# Patient Record
Sex: Female | Born: 1940 | Race: White | Hispanic: No | Smoking: Former smoker
Health system: Southern US, Community
[De-identification: ages and names within clinical notes are randomized; demographics above are authoritative.]

## PROBLEM LIST (undated history)

## (undated) DIAGNOSIS — H5440 Blindness, one eye, unspecified eye: Secondary | ICD-10-CM

## (undated) DIAGNOSIS — T7840XA Allergy, unspecified, initial encounter: Secondary | ICD-10-CM

## (undated) DIAGNOSIS — K219 Gastro-esophageal reflux disease without esophagitis: Secondary | ICD-10-CM

## (undated) DIAGNOSIS — J189 Pneumonia, unspecified organism: Secondary | ICD-10-CM

## (undated) DIAGNOSIS — K8020 Calculus of gallbladder without cholecystitis without obstruction: Secondary | ICD-10-CM

## (undated) DIAGNOSIS — A770 Spotted fever due to Rickettsia rickettsii: Secondary | ICD-10-CM

## (undated) DIAGNOSIS — Z923 Personal history of irradiation: Secondary | ICD-10-CM

## (undated) DIAGNOSIS — K5730 Diverticulosis of large intestine without perforation or abscess without bleeding: Secondary | ICD-10-CM

## (undated) DIAGNOSIS — M7120 Synovial cyst of popliteal space [Baker], unspecified knee: Secondary | ICD-10-CM

## (undated) DIAGNOSIS — Z609 Problem related to social environment, unspecified: Secondary | ICD-10-CM

## (undated) DIAGNOSIS — R209 Unspecified disturbances of skin sensation: Secondary | ICD-10-CM

## (undated) DIAGNOSIS — R51 Headache: Secondary | ICD-10-CM

## (undated) DIAGNOSIS — Z659 Problem related to unspecified psychosocial circumstances: Secondary | ICD-10-CM

## (undated) DIAGNOSIS — Z515 Encounter for palliative care: Secondary | ICD-10-CM

## (undated) DIAGNOSIS — H547 Unspecified visual loss: Secondary | ICD-10-CM

## (undated) DIAGNOSIS — C541 Malignant neoplasm of endometrium: Secondary | ICD-10-CM

## (undated) DIAGNOSIS — H209 Unspecified iridocyclitis: Secondary | ICD-10-CM

## (undated) DIAGNOSIS — M1990 Unspecified osteoarthritis, unspecified site: Secondary | ICD-10-CM

## (undated) DIAGNOSIS — K449 Diaphragmatic hernia without obstruction or gangrene: Secondary | ICD-10-CM

## (undated) DIAGNOSIS — I8390 Asymptomatic varicose veins of unspecified lower extremity: Secondary | ICD-10-CM

## (undated) DIAGNOSIS — A6920 Lyme disease, unspecified: Secondary | ICD-10-CM

## (undated) DIAGNOSIS — B351 Tinea unguium: Secondary | ICD-10-CM

## (undated) DIAGNOSIS — G5600 Carpal tunnel syndrome, unspecified upper limb: Secondary | ICD-10-CM

## (undated) DIAGNOSIS — A809 Acute poliomyelitis, unspecified: Secondary | ICD-10-CM

## (undated) DIAGNOSIS — N289 Disorder of kidney and ureter, unspecified: Secondary | ICD-10-CM

## (undated) DIAGNOSIS — K259 Gastric ulcer, unspecified as acute or chronic, without hemorrhage or perforation: Secondary | ICD-10-CM

## (undated) DIAGNOSIS — N939 Abnormal uterine and vaginal bleeding, unspecified: Principal | ICD-10-CM

## (undated) DIAGNOSIS — R609 Edema, unspecified: Secondary | ICD-10-CM

## (undated) DIAGNOSIS — I872 Venous insufficiency (chronic) (peripheral): Secondary | ICD-10-CM

## (undated) DIAGNOSIS — C189 Malignant neoplasm of colon, unspecified: Secondary | ICD-10-CM

## (undated) DIAGNOSIS — R413 Other amnesia: Secondary | ICD-10-CM

## (undated) DIAGNOSIS — K635 Polyp of colon: Secondary | ICD-10-CM

## (undated) HISTORY — DX: Unspecified visual loss: H54.7

## (undated) HISTORY — DX: Carpal tunnel syndrome, unspecified upper limb: G56.00

## (undated) HISTORY — DX: Edema, unspecified: R60.9

## (undated) HISTORY — PX: BACK SURGERY: SHX140

## (undated) HISTORY — PX: ADENOIDECTOMY: SHX5191

## (undated) HISTORY — DX: Venous insufficiency (chronic) (peripheral): I87.2

## (undated) HISTORY — DX: Synovial cyst of popliteal space (Baker), unspecified knee: M71.20

## (undated) HISTORY — DX: Diverticulosis of large intestine without perforation or abscess without bleeding: K57.30

## (undated) HISTORY — DX: Unspecified disturbances of skin sensation: R20.9

## (undated) HISTORY — DX: Personal history of irradiation: Z92.3

## (undated) HISTORY — DX: Abnormal uterine and vaginal bleeding, unspecified: N93.9

## (undated) HISTORY — DX: Acute poliomyelitis, unspecified: A80.9

## (undated) HISTORY — PX: TONSILLECTOMY: SUR1361

## (undated) HISTORY — DX: Calculus of gallbladder without cholecystitis without obstruction: K80.20

## (undated) HISTORY — DX: Malignant neoplasm of colon, unspecified: C18.9

## (undated) HISTORY — DX: Asymptomatic varicose veins of unspecified lower extremity: I83.90

## (undated) HISTORY — DX: Tinea unguium: B35.1

## (undated) HISTORY — DX: Unspecified osteoarthritis, unspecified site: M19.90

---

## 2003-07-22 ENCOUNTER — Ambulatory Visit (HOSPITAL_COMMUNITY): Admission: RE | Admit: 2003-07-22 | Discharge: 2003-07-22 | Payer: Self-pay | Admitting: Family Medicine

## 2004-06-01 ENCOUNTER — Emergency Department (HOSPITAL_COMMUNITY): Admission: EM | Admit: 2004-06-01 | Discharge: 2004-06-01 | Payer: Self-pay | Admitting: Emergency Medicine

## 2004-08-13 ENCOUNTER — Ambulatory Visit: Payer: Self-pay | Admitting: Family Medicine

## 2005-02-14 ENCOUNTER — Emergency Department (HOSPITAL_COMMUNITY): Admission: EM | Admit: 2005-02-14 | Discharge: 2005-02-14 | Payer: Self-pay | Admitting: Emergency Medicine

## 2005-03-10 ENCOUNTER — Ambulatory Visit: Payer: Self-pay | Admitting: Internal Medicine

## 2005-03-10 ENCOUNTER — Ambulatory Visit (HOSPITAL_COMMUNITY): Admission: RE | Admit: 2005-03-10 | Discharge: 2005-03-10 | Payer: Self-pay | Admitting: Internal Medicine

## 2006-01-05 ENCOUNTER — Ambulatory Visit (HOSPITAL_COMMUNITY): Admission: RE | Admit: 2006-01-05 | Discharge: 2006-01-05 | Payer: Self-pay | Admitting: Family Medicine

## 2010-03-31 ENCOUNTER — Other Ambulatory Visit (INDEPENDENT_AMBULATORY_CARE_PROVIDER_SITE_OTHER): Payer: Self-pay | Admitting: Internal Medicine

## 2010-03-31 ENCOUNTER — Ambulatory Visit (INDEPENDENT_AMBULATORY_CARE_PROVIDER_SITE_OTHER): Payer: Medicare Other | Admitting: Internal Medicine

## 2010-03-31 DIAGNOSIS — K6289 Other specified diseases of anus and rectum: Secondary | ICD-10-CM

## 2010-03-31 DIAGNOSIS — Z86010 Personal history of colonic polyps: Secondary | ICD-10-CM

## 2010-03-31 DIAGNOSIS — R1310 Dysphagia, unspecified: Secondary | ICD-10-CM

## 2010-04-01 ENCOUNTER — Ambulatory Visit (HOSPITAL_COMMUNITY)
Admission: RE | Admit: 2010-04-01 | Discharge: 2010-04-01 | Disposition: A | Discharge status: Home / Self Care | Payer: Medicare Other | Source: Ambulatory Visit | Attending: Internal Medicine | Admitting: Internal Medicine

## 2010-04-01 DIAGNOSIS — K219 Gastro-esophageal reflux disease without esophagitis: Secondary | ICD-10-CM | POA: Insufficient documentation

## 2010-04-01 DIAGNOSIS — R1013 Epigastric pain: Secondary | ICD-10-CM | POA: Insufficient documentation

## 2010-04-01 DIAGNOSIS — R1310 Dysphagia, unspecified: Secondary | ICD-10-CM | POA: Insufficient documentation

## 2010-04-23 ENCOUNTER — Encounter (INDEPENDENT_AMBULATORY_CARE_PROVIDER_SITE_OTHER): Payer: Medicare Other | Admitting: Internal Medicine

## 2010-04-23 ENCOUNTER — Ambulatory Visit (HOSPITAL_COMMUNITY): Admission: RE | Admit: 2010-04-23 | Payer: Medicare Other | Source: Ambulatory Visit | Admitting: Internal Medicine

## 2010-05-17 NOTE — Consult Note (Signed)
Tanya Ellis, Tanya Ellis                  ACCOUNT NO.:  [id number]  MEDICAL RECORD NO.:  [id number]           PATIENT TYPE:  O  LOCATION:  Office                       FACILITY:  [facility]  PHYSICIAN:  Lionel December, M.D.    DATE OF BIRTH:  06-24-1940  DATE OF CONSULTATION: 04/01/2010 DATE OF DISCHARGE:                                  CONSULTATION   REASON FOR CONSULTATION:  Colonoscopy/history of polyps.  HISTORY OF PRESENT ILLNESS:  Tanya Ellis is a 70 year old female referred to our office by Dr. Sudie Bailey for colonoscopy with a history of colon polyps.  Tanya Ellis states that her abdominal gas is better since starting the omeprazole first of the month.  Her epigastric pain is better.  Her acid reflux is better.  She does state that at times it feels like something is pressing on her rectum and this has occurred about 3 times in the past.  She states at times when she eat lot such as if she eats banana, it feels like the banana is slow to go down.  Foods that are chunky feel like they are slow to go down.  She usually has 1 to 2 bowel movements a day.  Her appetite is good.  There has been no weight loss. She denies any melena or bright red rectal bleeding.  Her last colonoscopy was in 2007 for surveillance colonoscopy.  She has a history of adenomas.  Per Dr. Patty Sermons note, there is a family history that is positive for colon cancer in her mother.  In 2005, she underwent an esophagoscopy, this was a capsule study, which revealed esophageal transit time was 6 minutes and 53 seconds.  The mucosa of the esophagus was normal.  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  There was no evidence of esophagitis, noncritical Schatzki's ring, small sliding hiatal hernia.  Labs on February 20, 2010, sodium 138, potassium 4.2, chloride 104, CO2 27, glucose 96, BUN 11, creatinine 0.76, bilirubin 0.6, ALP 60, AST 17, ALT 13, total protein 6.2, albumin 4.1, calcium 9.1,  WBC 6.8, hemoglobin 12.3, hematocrit 37.2, platelet count was low at 149, slightly low. Amylase 27 and lipase 10.  HOME MEDICATIONS:  She is on omeprazole 40 mg one a day.  ALLERGIES:  She is allergic to SULFA.  PAST SURGICAL HISTORY:  She has had back surgery twice for bone spurs. She had a T and A as a child.  She has no medical problems.  FAMILY HISTORY:  Mother is deceased from a TIA.  Father is deceased, he was killed in world war 2.  One sister in good health.  She is married. She is self-employed as an Medical laboratory scientific officer.  She does not smoke, drink, or do drugs.  Two children, one has a history of an MI and CVA at age 37 and one child who is deaf and has fibromyalgia.  OBJECTIVE:  VITAL SIGNS:  Her weight is 158.9, height 5 feet 4 inches, temperature 97, blood pressure is 132/82, pulse 72. HEENT:  She has upper dentures.  Her lower teeth are natural.  Her oral mucosa is moist, no lesions.  Her conjunctivae are pink.  Her sclerae are anicteric. NECK:  Her thyroid is normal.  There is no cervical lymphadenopathy. LUNGS:  Clear. ABDOMEN:  Soft.  Bowel sounds are positive.  She does have some lower abdominal suprapubic pain and epigastric tenderness.  ASSESSMENT:  Tanya Ellis is a 70 year old female who is due for a surveillance colonoscopy for adenomas.  We will schedule a colonoscopy. She also complains of some dysphagia, which actually is better since starting the omeprazole.  RECOMMENDATIONS: [facility]illi[facility] colonoscopy  We will also schedule a barium pill study for her.  If normal and she continues to have problems after a progress report in 2 weeks, we will schedule an EGD/ED with Dr. Karilyn Cota also and thank you for allowing [place] to participate in her care.    ______________________________ Dorene Ar, NP   ______________________________ Lionel December, M.D.    TS/MEDQ  D:  04/01/2010  T:  04/02/2010  Job:  [id number]  cc:   Mila Homer. Sudie Bailey, M.D. Fax:  [phone]  Electronically Signed by Dorene Ar PA on 05/15/2010 11:36:23 AM Electronically Signed by Lionel December M.D. on 05/17/2010 01:24:36 PM

## 2010-07-19 ENCOUNTER — Emergency Department (HOSPITAL_COMMUNITY)
Admission: EM | Admit: 2010-07-19 | Discharge: 2010-07-19 | Disposition: A | Discharge status: Home / Self Care | Payer: Medicare Other | Attending: Emergency Medicine | Admitting: Emergency Medicine

## 2010-07-19 DIAGNOSIS — W268XXA Contact with other sharp object(s), not elsewhere classified, initial encounter: Secondary | ICD-10-CM | POA: Insufficient documentation

## 2010-07-19 DIAGNOSIS — S91309A Unspecified open wound, unspecified foot, initial encounter: Secondary | ICD-10-CM | POA: Insufficient documentation

## 2010-07-19 DIAGNOSIS — Z23 Encounter for immunization: Secondary | ICD-10-CM | POA: Insufficient documentation

## 2010-09-05 ENCOUNTER — Other Ambulatory Visit: Payer: Self-pay

## 2010-09-05 ENCOUNTER — Emergency Department (HOSPITAL_COMMUNITY)
Admission: EM | Admit: 2010-09-05 | Discharge: 2010-09-05 | Disposition: A | Discharge status: Home / Self Care | Payer: Medicare Other | Attending: Emergency Medicine | Admitting: Emergency Medicine

## 2010-09-05 ENCOUNTER — Emergency Department (HOSPITAL_COMMUNITY): Payer: Medicare Other

## 2010-09-05 DIAGNOSIS — Z8619 Personal history of other infectious and parasitic diseases: Secondary | ICD-10-CM | POA: Insufficient documentation

## 2010-09-05 DIAGNOSIS — R05 Cough: Secondary | ICD-10-CM | POA: Insufficient documentation

## 2010-09-05 DIAGNOSIS — R059 Cough, unspecified: Secondary | ICD-10-CM | POA: Insufficient documentation

## 2010-09-05 DIAGNOSIS — J40 Bronchitis, not specified as acute or chronic: Secondary | ICD-10-CM | POA: Insufficient documentation

## 2010-09-05 HISTORY — DX: Acute poliomyelitis, unspecified: A80.9

## 2010-09-05 HISTORY — DX: Spotted fever due to Rickettsia rickettsii: A77.0

## 2010-09-05 HISTORY — DX: Lyme disease, unspecified: A69.20

## 2010-09-05 LAB — BASIC METABOLIC PANEL
Calcium: 9.5 mg/dL (ref 8.4–10.5)
GFR calc non Af Amer: >60 mL/min (ref >60)
Glucose, Bld: 105 mg/dL — ABNORMAL HIGH (ref 70–99)
Sodium: 133 mEq/L — ABNORMAL LOW (ref 135–145)

## 2010-09-05 LAB — CBC
MCH: 30.7 pg (ref 26.0–34.0)
MCHC: 34.1 g/dL (ref 30.0–36.0)
Platelets: 110 10*3/uL — ABNORMAL LOW (ref 150–400)

## 2010-09-05 LAB — CARDIAC PANEL(CRET KIN+CKTOT+MB+TROPI)
CK, MB: 5 ng/mL — ABNORMAL HIGH (ref 0.3–4.0)
Total CK: 106 U/L (ref 7–177)

## 2010-09-05 MED ORDER — ALBUTEROL SULFATE (2.5 MG/3ML) 0.083% IN NEBU
INHALATION_SOLUTION | RESPIRATORY_TRACT | Status: AC
  Administered 2010-09-05: 5 mg
  Filled 2010-09-05: qty 6

## 2010-09-05 MED ORDER — ALBUTEROL SULFATE HFA 108 (90 BASE) MCG/ACT IN AERS
2.0000 | INHALATION_SPRAY | Freq: Once | RESPIRATORY_TRACT | Status: AC
  Administered 2010-09-05: 2 via RESPIRATORY_TRACT
  Filled 2010-09-05: qty 6.7

## 2010-09-05 MED ORDER — LEVOFLOXACIN 500 MG PO TABS: 500.0000 mg | ORAL_TABLET | Freq: Every day | ORAL | Status: AC

## 2010-09-05 MED ORDER — ALBUTEROL SULFATE (5 MG/ML) 0.5% IN NEBU: 5.0000 mg | INHALATION_SOLUTION | Freq: Once | RESPIRATORY_TRACT | Status: DC

## 2010-09-05 MED ORDER — DEXTROSE 5 % IV SOLN
1.0000 g | Freq: Once | INTRAVENOUS | Status: AC
  Administered 2010-09-05: 1 g via INTRAVENOUS
  Filled 2010-09-05: qty 1

## 2010-09-05 MED ORDER — ALBUTEROL SULFATE HFA 108 (90 BASE) MCG/ACT IN AERS: 1.0000 | INHALATION_SPRAY | Freq: Four times a day (QID) | RESPIRATORY_TRACT | Status: DC | PRN

## 2010-09-05 MED ORDER — SODIUM CHLORIDE 0.9 % IJ SOLN
10.0000 mL | Freq: Once | INTRAMUSCULAR | Status: AC
  Administered 2010-09-05: 10 mL via INTRAVENOUS

## 2010-09-05 MED ORDER — ALBUTEROL SULFATE (2.5 MG/3ML) 0.083% IN NEBU
INHALATION_SOLUTION | RESPIRATORY_TRACT | Status: AC
  Administered 2010-09-05: 5 mg via RESPIRATORY_TRACT
  Filled 2010-09-05: qty 6

## 2010-09-05 MED ORDER — ALBUTEROL SULFATE (5 MG/ML) 0.5% IN NEBU
2.5000 mg | INHALATION_SOLUTION | Freq: Once | RESPIRATORY_TRACT | Status: AC
  Administered 2010-09-05: 5 mg via RESPIRATORY_TRACT

## 2010-09-05 MED ORDER — IPRATROPIUM BROMIDE 0.02 % IN SOLN
0.5000 mg | Freq: Once | RESPIRATORY_TRACT | Status: AC
  Administered 2010-09-05: 0.5 mg via RESPIRATORY_TRACT
  Filled 2010-09-05: qty 2.5

## 2010-09-05 MED ORDER — SODIUM CHLORIDE 0.9 % IJ SOLN
INTRAMUSCULAR | Status: AC
  Administered 2010-09-05: 21:00:00
  Filled 2010-09-05: qty 10

## 2010-09-05 MED ORDER — AEROCHAMBER Z-STAT PLUS/MEDIUM MISC
1.0000 | Freq: Once | Status: AC
  Administered 2010-09-05: 1

## 2010-09-05 NOTE — ED Provider Notes (Signed)
History     Chief Complaint  Patient presents with  . Cough   Patient is a 70 y.o. female presenting with cough. The history is provided by the patient. No language interpreter was used.  Cough This is a new problem. The current episode started more than 1 week ago. The problem occurs constantly. The problem has not changed since onset.The cough is productive of purulent sputum. The maximum temperature recorded prior to her arrival was 103 to 104 F. The fever has been present for 5 days or more. Associated symptoms include chest pain, chills, shortness of breath and wheezing. Pertinent negatives include no sweats, no weight loss and no headaches. She has tried nothing for the symptoms. The treatment provided no relief. She is not a smoker. Her past medical history does not include pneumonia, bronchiectasis or emphysema.    Past Medical History  Diagnosis Date  . [facility] spotted fever   . Lyme disease   . Polio   . Measles   . Mumps     Past Surgical History  Procedure Date  . Back surgery   . Tonsillectomy   . Adenoidectomy     No family history on file.  History  Substance Use Topics  . Smoking status: Never Smoker   . Smokeless tobacco: Not on file  . Alcohol Use: No    OB History    Grav Para Term Preterm Abortions TAB SAB Ect Mult Living                  Review of Systems  Constitutional: Positive for fever and chills. Negative for weight loss.  HENT: Negative for facial swelling.   Eyes: Negative for discharge.  Respiratory: Positive for cough, shortness of breath and wheezing.   Cardiovascular: Positive for chest pain.  Genitourinary: Negative for difficulty urinating.  Musculoskeletal: Negative for arthralgias.  Skin: Negative.   Neurological: Negative for headaches.  Hematological: Negative.   Psychiatric/Behavioral: Negative.     Physical Exam  BP 114/87  Pulse 74  Temp(Src) 98.3 F (36.8 C) (Oral)  Resp 21  Ht 5\' 4"  (1.626 m)  Wt 146 lb  (66.225 kg)  BMI 25.06 kg/m2  SpO2 99%  Physical Exam  Constitutional: She is oriented to person, place, and time. She appears well-developed and well-nourished.  HENT:  Head: Normocephalic and atraumatic.  Eyes: EOM are normal. Pupils are equal, round, and reactive to light.  Neck: Normal range of motion. Neck supple.  Cardiovascular: Normal rate and regular rhythm.   Pulmonary/Chest: No accessory muscle usage. Tachypnea noted. No apnea. She has decreased breath sounds in the right upper field, the right middle field, the right lower field, the left upper field, the left middle field and the left lower field. She has wheezes. She has rhonchi. She has no rales.  Abdominal: Soft. Bowel sounds are normal. She exhibits no distension. There is no tenderness. There is no rebound and no guarding.  Musculoskeletal: Normal range of motion.  Neurological: She is alert and oriented to person, place, and time.  Skin: Skin is warm and dry.  Psychiatric: She has a normal mood and affect.    ED Course  Procedures  MDM  Date: 09/05/2010  Rate: 66  Rhythm: normal sinus rhythm  QRS Axis: normal  Intervals: normal  ST/T Wave abnormalities: normal  Conduction Disutrbances:none  Narrative Interpretation: normal  Old EKG Reviewed: none available       Jennings Corado K Jaquanda Wickersham-Rasch, MD 09/05/10 2315

## 2010-09-05 NOTE — ED Notes (Signed)
Police sitting with patient - pt denies pain or needs at present

## 2010-09-05 NOTE — ED Notes (Signed)
Pt c/o cough with thick yellow/brown sputum for 3-4 days, worse today, pt called ems out because she became choked on thick sputum, fever this am,

## 2010-09-05 NOTE — ED Notes (Signed)
Pt has had cough,fever, brownish/greenish sputum for several days that has progressively gotten worse. Pt became choked today on sputum and EMS. nad noted upon arrival.

## 2010-09-05 NOTE — ED Notes (Signed)
Pt states expectorated a large mucous plug and feels much better - would like to come home - MD notified and went to auscultate pt and she sounds much better - MD allowing pt to be d/c'd home

## 2010-09-05 NOTE — ED Notes (Signed)
Respiratory Therapy notified regarding neb treatment

## 2010-09-10 LAB — CULTURE, BLOOD (ROUTINE X 2)

## 2011-02-09 ENCOUNTER — Other Ambulatory Visit: Payer: Self-pay

## 2011-02-09 ENCOUNTER — Emergency Department (HOSPITAL_COMMUNITY): Payer: Medicare Other

## 2011-02-09 ENCOUNTER — Emergency Department (HOSPITAL_COMMUNITY)
Admission: EM | Admit: 2011-02-09 | Discharge: 2011-02-09 | Disposition: A | Discharge status: Home / Self Care | Payer: Medicare Other | Attending: Emergency Medicine | Admitting: Emergency Medicine

## 2011-02-09 ENCOUNTER — Encounter (HOSPITAL_COMMUNITY): Payer: Self-pay | Admitting: *Deleted

## 2011-02-09 DIAGNOSIS — Z860100 Personal history of colon polyps, unspecified: Secondary | ICD-10-CM | POA: Insufficient documentation

## 2011-02-09 DIAGNOSIS — K449 Diaphragmatic hernia without obstruction or gangrene: Secondary | ICD-10-CM | POA: Insufficient documentation

## 2011-02-09 DIAGNOSIS — K219 Gastro-esophageal reflux disease without esophagitis: Secondary | ICD-10-CM | POA: Insufficient documentation

## 2011-02-09 DIAGNOSIS — R51 Headache: Secondary | ICD-10-CM | POA: Insufficient documentation

## 2011-02-09 DIAGNOSIS — N390 Urinary tract infection, site not specified: Secondary | ICD-10-CM

## 2011-02-09 DIAGNOSIS — H5440 Blindness, one eye, unspecified eye: Secondary | ICD-10-CM | POA: Insufficient documentation

## 2011-02-09 DIAGNOSIS — R072 Precordial pain: Secondary | ICD-10-CM | POA: Insufficient documentation

## 2011-02-09 DIAGNOSIS — I498 Other specified cardiac arrhythmias: Secondary | ICD-10-CM | POA: Insufficient documentation

## 2011-02-09 HISTORY — DX: Gastro-esophageal reflux disease without esophagitis: K21.9

## 2011-02-09 HISTORY — DX: Unspecified iridocyclitis: H20.9

## 2011-02-09 HISTORY — DX: Headache: R51

## 2011-02-09 HISTORY — DX: Blindness, one eye, unspecified eye: H54.40

## 2011-02-09 HISTORY — DX: Polyp of colon: K63.5

## 2011-02-09 HISTORY — DX: Diaphragmatic hernia without obstruction or gangrene: K44.9

## 2011-02-09 LAB — URINALYSIS, ROUTINE W REFLEX MICROSCOPIC
Nitrite: NEGATIVE
Specific Gravity, Urine: 1.01 (ref 1.005–1.030)
pH: 6.5 (ref 5.0–8.0)

## 2011-02-09 LAB — DIFFERENTIAL
Eosinophils Absolute: 0.1 10*3/uL (ref 0.0–0.7)
Eosinophils Relative: 2 % (ref 0–5)
Lymphocytes Relative: 38 % (ref 12–46)
Lymphs Abs: 2.4 10*3/uL (ref 0.7–4.0)
Monocytes Relative: 8 % (ref 3–12)

## 2011-02-09 LAB — CBC
Hemoglobin: 12.4 g/dL (ref 12.0–15.0)
MCH: 30.9 pg (ref 26.0–34.0)
RBC: 4.01 MIL/uL (ref 3.87–5.11)

## 2011-02-09 LAB — BASIC METABOLIC PANEL
CO2: 26 mEq/L (ref 19–32)
Calcium: 10 mg/dL (ref 8.4–10.5)
Chloride: 102 mEq/L (ref 96–112)
Glucose, Bld: 107 mg/dL — ABNORMAL HIGH (ref 70–99)
Potassium: 3.6 mEq/L (ref 3.5–5.1)
Sodium: 137 mEq/L (ref 135–145)

## 2011-02-09 LAB — POCT I-STAT TROPONIN I

## 2011-02-09 LAB — URINE MICROSCOPIC-ADD ON

## 2011-02-09 MED ORDER — CEPHALEXIN 500 MG PO CAPS: 500.0000 mg | ORAL_CAPSULE | Freq: Four times a day (QID) | ORAL | Status: AC

## 2011-02-09 MED ORDER — OXYCODONE-ACETAMINOPHEN 5-325 MG PO TABS
1.0000 | ORAL_TABLET | Freq: Once | ORAL | Status: DC
  Filled 2011-02-09: qty 1

## 2011-02-09 MED ORDER — FAMOTIDINE IN NACL 20-0.9 MG/50ML-% IV SOLN
20.0000 mg | Freq: Once | INTRAVENOUS | Status: AC
  Administered 2011-02-09: 20 mg via INTRAVENOUS
  Filled 2011-02-09: qty 50

## 2011-02-09 MED ORDER — GI COCKTAIL ~~LOC~~
30.0000 mL | Freq: Once | ORAL | Status: AC
  Administered 2011-02-09: 30 mL via ORAL
  Filled 2011-02-09: qty 30

## 2011-02-09 MED ORDER — SODIUM CHLORIDE 0.9 % IV SOLN
INTRAVENOUS | Status: DC
  Administered 2011-02-09: 15:00:00 via INTRAVENOUS

## 2011-02-09 NOTE — ED Notes (Signed)
Pt c/o substernal chest pain for a couple hours. Pt also c/o nausea and headache.

## 2011-02-09 NOTE — ED Notes (Signed)
Pt left d/c papers with rx in room.  Called pt's home number with no answer, unable to leave message.  Will attempt to call back at a later time.

## 2011-02-09 NOTE — ED Notes (Signed)
Patient ambulatory to restroom. Urine sample collected if needed.

## 2011-02-09 NOTE — ED Notes (Signed)
Pt states is feeling better and wanting to know status of disposition because she needs to go home.  edp notified.

## 2011-02-09 NOTE — ED Provider Notes (Signed)
History     CSN: [id number] Arrival date & time: 02/09/2011  1:29 PM   Chief Complaint  Patient presents with  . Chest Pain   HPI Pt was seen at 1420.  Per pt, c/o gradual onset and persistence of constant frontal headache and lower mid-sternal chest "pain" since overnight last night.  Pt describes the CP as "burning" and "sometimes sharp."  States she has a history of headaches, this headache is per her usual pain pattern, and she has not taken any OTC's for same.  Pt's symptoms have been assoc with mild nausea, "feeling anxious" and "feelling numb around my lips" since last night. States she "passed a lot of gas" on arrival to ED which has started to relieve her symptoms. Denies abd pain, no palpitations, no SOB/cough, no fevers, no back pain, no visual changes, no dysphagia, no focal motor weakness, no tingling/numbness in extremities.   GI:  Dr. Karilyn Cota Past Medical History  Diagnosis Date  . [facility] spotted fever   . Lyme disease   . Polio   . Measles   . Mumps   . GERD (gastroesophageal reflux disease)   . Sliding hiatal hernia   . Headache   . Uveitis   . Blind right eye   . Colon polyp     Past Surgical History  Procedure Date  . Back surgery   . Tonsillectomy   . Adenoidectomy     History  Substance Use Topics  . Smoking status: Never Smoker   . Smokeless tobacco: Not on file  . Alcohol Use: No    Review of Systems ROS: Statement: All systems negative except as marked or noted in the HPI; Constitutional: Negative for fever and chills. ; ; Eyes: Negative for eye pain, redness and discharge. ; ; ENMT: Negative for ear pain, hoarseness, nasal congestion, sinus pressure and sore throat. ; ; Cardiovascular: +CP.  Negative for palpitations, diaphoresis, dyspnea and peripheral edema. ; ; Respiratory: Negative for cough, wheezing and stridor. ; ; Gastrointestinal: Negative for vomiting, diarrhea, abdominal pain, blood in stool, hematemesis, jaundice and rectal  bleeding. . ; ; Genitourinary: Negative for dysuria, flank pain and hematuria. ; ; Musculoskeletal: Negative for back pain and neck pain. Negative for swelling and trauma.; ; Skin: Negative for pruritus, rash, abrasions, blisters, bruising and skin lesion.; ; Neuro: +headache.  Negative for lightheadedness and neck stiffness. Negative for weakness, altered level of consciousness , altered mental status, extremity weakness, paresthesias, involuntary movement, seizure and syncope.     Allergies  Sulfa antibiotics  Home Medications  No current outpatient prescriptions on file.  BP 149/73  Pulse 75  Temp(Src) 97.7 F (36.5 C) (Oral)  Resp 16  Ht 5\' 4"  (1.626 m)  Wt 138 lb (62.596 kg)  BMI 23.69 kg/m2  SpO2 100%  Physical Exam 1425: Physical examination:  Nursing notes reviewed; Vital signs and O2 SAT reviewed;  Constitutional: Well developed, Well nourished, Well hydrated, In no acute distress; Head:  Normocephalic, atraumatic; Eyes: EOMI, PERRL, No scleral icterus; ENMT: Mouth and pharynx normal, Mucous membranes moist; Neck: Supple, Full range of motion, No lymphadenopathy; Cardiovascular: Regular rate and rhythm, No murmur, rub, or gallop; Respiratory: Breath sounds clear & equal bilaterally, No rales, rhonchi, wheezes, or rub, Normal respiratory effort/excursion; Chest: Nontender, Movement normal; Abdomen: Soft, Nontender, Nondistended, Normal bowel sounds; Genitourinary: No CVA tenderness; Extremities: Pulses normal, No tenderness, No edema, No calf edema or asymmetry.; Neuro: AA&Ox3, Major CN grossly intact. No facial droop, speech clear.  Normal coordination. Gait steady. No gross focal motor or sensory deficits in extremities.; Skin: Color normal, Warm, Dry, no rash. Psych:  Anxious.    ED Course  Procedures    MDM  MDM Reviewed: nursing note, vitals and previous chart Interpretation: ECG, labs and x-ray    Date: 02/09/2011  Rate: 69  Rhythm: sinus arrhythmia  QRS Axis:  normal  Intervals: normal  ST/T Wave abnormalities: normal  Conduction Disutrbances:none  Narrative Interpretation:   Old EKG Reviewed: none available.  Results for orders placed during the hospital encounter of 02/09/11  CBC      Component Value Range   WBC 6.2  4.0 - 10.5 (K/uL)   RBC 4.01  3.87 - 5.11 (MIL/uL)   Hemoglobin 12.4  12.0 - 15.0 (g/dL)   HCT 21.3  08.6 - 57.8 (%)   MCV 90.5  78.0 - 100.0 (fL)   MCH 30.9  26.0 - 34.0 (pg)   MCHC 34.2  30.0 - 36.0 (g/dL)   RDW 46.9  62.9 - 52.8 (%)   Platelets 145 (*) 150 - 400 (K/uL)  BASIC METABOLIC PANEL      Component Value Range   Sodium 137  135 - 145 (mEq/L)   Potassium 3.6  3.5 - 5.1 (mEq/L)   Chloride 102  96 - 112 (mEq/L)   CO2 26  19 - 32 (mEq/L)   Glucose, Bld 107 (*) 70 - 99 (mg/dL)   BUN 11  6 - 23 (mg/dL)   Creatinine, Ser 4.13  0.50 - 1.10 (mg/dL)   Calcium 24.4  8.4 - 10.5 (mg/dL)   GFR calc non Af Amer 83 (*) >90 (mL/min)   GFR calc Af Amer >90  >90 (mL/min)  POCT I-STAT TROPONIN I      Component Value Range   Troponin i, poc 0.00  0.00 - 0.08 (ng/mL)   Comment 3           URINALYSIS, ROUTINE W REFLEX MICROSCOPIC      Component Value Range   Color, Urine YELLOW  YELLOW    APPearance CLEAR  CLEAR    Specific Gravity, Urine 1.010  1.005 - 1.030    pH 6.5  5.0 - 8.0    Glucose, UA NEGATIVE  NEGATIVE (mg/dL)   Hgb urine dipstick NEGATIVE  NEGATIVE    Bilirubin Urine NEGATIVE  NEGATIVE    Ketones, ur NEGATIVE  NEGATIVE (mg/dL)   Protein, ur NEGATIVE  NEGATIVE (mg/dL)   Urobilinogen, UA 0.2  0.0 - 1.0 (mg/dL)   Nitrite NEGATIVE  NEGATIVE    Leukocytes, UA SMALL (*) NEGATIVE   DIFFERENTIAL      Component Value Range   Neutrophils Relative 51  43 - 77 (%)   Neutro Abs 3.2  1.7 - 7.7 (K/uL)   Lymphocytes Relative 38  12 - 46 (%)   Lymphs Abs 2.4  0.7 - 4.0 (K/uL)   Monocytes Relative 8  3 - 12 (%)   Monocytes Absolute 0.5  0.1 - 1.0 (K/uL)   Eosinophils Relative 2  0 - 5 (%)   Eosinophils Absolute 0.1   0.0 - 0.7 (K/uL)   Basophils Relative 1  0 - 1 (%)   Basophils Absolute 0.0  0.0 - 0.1 (K/uL)  URINE MICROSCOPIC-ADD ON      Component Value Range   WBC, UA 7-10  <3 (WBC/hpf)   Bacteria, UA FEW (*) RARE    Ct Head Wo Contrast  02/09/2011  *RADIOLOGY REPORT*  Clinical Data:  Headache, chest pain, nausea, history of polio, Lyme disease  CT HEAD WITHOUT CONTRAST  Technique:  Contiguous axial images were obtained from the base of the skull through the vertex without contrast.  Comparison: 06/01/2004  Findings: Normal ventricular morphology. No midline shift or mass effect. Normal appearance of brain parenchyma. No intracranial hemorrhage, mass lesion, or acute infarction. Visualized paranasal sinuses and mastoid air cells clear. Bones unremarkable.  IMPRESSION: No acute intracranial abnormalities.  Original Report Authenticated By: Lollie Marrow, M.D.   Chest Portable 1 View  02/09/2011  *RADIOLOGY REPORT*  Clinical Data: Chest pain  PORTABLE CHEST - 1 VIEW  Comparison: Portable exam 1400 hours compared to 09/05/2010  Findings: Upper-normal size of cardiac silhouette. Atherosclerotic calcification aorta. Pulmonary vascularity normal. Lungs clear. No pleural effusion or pneumothorax. No acute osseous findings.  IMPRESSION: No acute abnormalities.  Original Report Authenticated By: Lollie Marrow, M.D.     5:11 PM:  Pt states she feels "much better now" after GI cocktail and wants to go home.  Long hx of GERD well documented in EPIC/E-chart on review, and states she has not been taking her PPI due to "it makes me constipated."  Long d/w pt regarding need to take her GI meds.  She did not want the percocet.  Tol PO well in ED without N/V, has ambulated with steady gait/easy resps/no CP or SOB.  Doubt ACS as cause for symptoms given normal EKG and troponin after continuous symptoms since overnight last night. Endorses headache is per her usual and that she "just didn't take anything for it." Pt does not  want to stay any longer and wants to leave now. Will tx for UTI as UC pending.  Dx testing d/w pt.  Questions answered.  Verb understanding, agreeable to d/c home with outpt f/u.         Jalexis Breed Allison Quarry, DO 02/10/11 1155

## 2011-02-10 LAB — URINE CULTURE: Culture  Setup Time: 201212190218

## 2011-03-13 ENCOUNTER — Encounter (HOSPITAL_COMMUNITY): Payer: Self-pay

## 2011-03-13 ENCOUNTER — Emergency Department (HOSPITAL_COMMUNITY): Payer: Medicare Other

## 2011-03-13 ENCOUNTER — Emergency Department (HOSPITAL_COMMUNITY)
Admission: EM | Admit: 2011-03-13 | Discharge: 2011-03-13 | Disposition: A | Discharge status: Home / Self Care | Payer: Medicare Other | Attending: Emergency Medicine | Admitting: Emergency Medicine

## 2011-03-13 DIAGNOSIS — S6990XA Unspecified injury of unspecified wrist, hand and finger(s), initial encounter: Secondary | ICD-10-CM

## 2011-03-13 DIAGNOSIS — S6000XA Contusion of unspecified finger without damage to nail, initial encounter: Secondary | ICD-10-CM | POA: Insufficient documentation

## 2011-03-13 DIAGNOSIS — S6980XA Other specified injuries of unspecified wrist, hand and finger(s), initial encounter: Secondary | ICD-10-CM | POA: Insufficient documentation

## 2011-03-13 DIAGNOSIS — R51 Headache: Secondary | ICD-10-CM | POA: Insufficient documentation

## 2011-03-13 DIAGNOSIS — X58XXXA Exposure to other specified factors, initial encounter: Secondary | ICD-10-CM | POA: Insufficient documentation

## 2011-03-13 DIAGNOSIS — R110 Nausea: Secondary | ICD-10-CM | POA: Insufficient documentation

## 2011-03-13 DIAGNOSIS — B9789 Other viral agents as the cause of diseases classified elsewhere: Secondary | ICD-10-CM | POA: Insufficient documentation

## 2011-03-13 DIAGNOSIS — B349 Viral infection, unspecified: Secondary | ICD-10-CM

## 2011-03-13 DIAGNOSIS — R197 Diarrhea, unspecified: Secondary | ICD-10-CM | POA: Insufficient documentation

## 2011-03-13 LAB — CBC
HCT: 34 % — ABNORMAL LOW (ref 36.0–46.0)
Hemoglobin: 11.6 g/dL — ABNORMAL LOW (ref 12.0–15.0)
MCH: 30.6 pg (ref 26.0–34.0)
MCHC: 34.1 g/dL (ref 30.0–36.0)
RBC: 3.79 MIL/uL — ABNORMAL LOW (ref 3.87–5.11)

## 2011-03-13 LAB — BASIC METABOLIC PANEL
BUN: 9 mg/dL (ref 6–23)
CO2: 25 mEq/L (ref 19–32)
GFR calc non Af Amer: 85 mL/min — ABNORMAL LOW (ref >90)
Glucose, Bld: 125 mg/dL — ABNORMAL HIGH (ref 70–99)
Potassium: 3.6 mEq/L (ref 3.5–5.1)

## 2011-03-13 MED ORDER — HYDROCODONE-ACETAMINOPHEN 5-325 MG PO TABS: 1.0000 | ORAL_TABLET | Freq: Four times a day (QID) | ORAL | Status: AC | PRN

## 2011-03-13 MED ORDER — DIPHENHYDRAMINE HCL 50 MG/ML IJ SOLN
25.0000 mg | Freq: Once | INTRAMUSCULAR | Status: AC
  Administered 2011-03-13: 25 mg via INTRAVENOUS

## 2011-03-13 MED ORDER — SODIUM CHLORIDE 0.9 % IV BOLUS (SEPSIS)
500.0000 mL | Freq: Once | INTRAVENOUS | Status: AC
  Administered 2011-03-13: 500 mL via INTRAVENOUS

## 2011-03-13 MED ORDER — ONDANSETRON HCL 4 MG/2ML IJ SOLN
4.0000 mg | Freq: Once | INTRAMUSCULAR | Status: AC
  Administered 2011-03-13: 4 mg via INTRAVENOUS
  Filled 2011-03-13: qty 2

## 2011-03-13 MED ORDER — DIPHENHYDRAMINE HCL 50 MG/ML IJ SOLN
INTRAMUSCULAR | Status: AC
  Filled 2011-03-13: qty 1

## 2011-03-13 MED ORDER — VANCOMYCIN HCL IN DEXTROSE 1-5 GM/200ML-% IV SOLN
1000.0000 mg | Freq: Once | INTRAVENOUS | Status: AC
  Administered 2011-03-13: 1000 mg via INTRAVENOUS
  Filled 2011-03-13: qty 200

## 2011-03-13 MED ORDER — DOXYCYCLINE HYCLATE 100 MG PO CAPS: 100.0000 mg | ORAL_CAPSULE | Freq: Two times a day (BID) | ORAL | Status: DC

## 2011-03-13 MED ORDER — ONDANSETRON 4 MG PO TBDP: 4.0000 mg | ORAL_TABLET | Freq: Three times a day (TID) | ORAL | Status: AC | PRN

## 2011-03-13 MED ORDER — SODIUM CHLORIDE 0.9 % IV SOLN: INTRAVENOUS | Status: DC

## 2011-03-13 NOTE — ED Notes (Signed)
Pt reports body aches and chills.  Pt states she had abdominal pain that started this morning and watery stools x 2 today.  Pt states her stomach stopped hurting around the time she arrived in the ED.  Pt states she has not eaten anything today.

## 2011-03-13 NOTE — ED Notes (Signed)
Pt c/o itching to back of her head and center of chest. No rash noted. Pt medicated with Benadryl. Pt ambulated to restroom with assistance. NAD.

## 2011-03-13 NOTE — ED Notes (Signed)
Patient is alert and oriented x 4 with respirations even and unlabored.  NAD at this time.  Discharge instructions reviewed with patient and patient verbalized understanding.  Pt and family member verbalized that she will follow up with Dr. Sudie Bailey on Monday.  Pt ambulated to lobby with steady gait, and family member to transport pt home.

## 2011-03-13 NOTE — ED Provider Notes (Signed)
History     CSN: [id number]  Arrival date & time 03/13/11  1217   First MD Initiated Contact with Patient 03/13/11 1318      Chief Complaint  Patient presents with  . Nausea  . Headache  . Diarrhea  . Generalized Body Aches  . Finger Injury    (Consider location/radiation/quality/duration/timing/severity/associated sxs/prior treatment) The history is provided by the patient.   Patient is a 71 year old female presenting to the ED with 2 different complaints. First complaint is that of nausea diarrhea bodyaches headache across the sinus area that started today. She's been taking care of her daughter this had the flu patient's complaint as mentioned has been for some nausea some diarrhea now improving headache not severe no congestion no cough but bodyaches.  The main reason why she came today is for her right middle finger. She does not exactly recall how she hurt it. Thinks she may have dropped something on it. He said she has a history of some carpal tunnel problems and was present have much feeling in those fingers.  The right middle finger is now from him in the nail bed is black and blue. Past Medical History  Diagnosis Date  . [facility] spotted fever   . Lyme disease   . Polio   . Measles   . Mumps   . GERD (gastroesophageal reflux disease)   . Sliding hiatal hernia   . Headache   . Uveitis   . Blind right eye   . Colon polyp     Past Surgical History  Procedure Date  . Back surgery   . Tonsillectomy   . Adenoidectomy     No family history on file.  History  Substance Use Topics  . Smoking status: Never Smoker   . Smokeless tobacco: Not on file  . Alcohol Use: No    OB History    Grav Para Term Preterm Abortions TAB SAB Ect Mult Living                  Review of Systems  Constitutional: Negative for fever and chills.  HENT: Positive for congestion. Negative for sore throat.   Eyes: Negative for pain and visual disturbance.  Respiratory:  Negative for cough and shortness of breath.   Cardiovascular: Negative for chest pain.  Gastrointestinal: Positive for nausea and diarrhea. Negative for vomiting and abdominal pain.  Genitourinary: Negative for dysuria.  Musculoskeletal: Positive for joint swelling. Negative for back pain.  Neurological: Positive for numbness and headaches. Negative for weakness.  Hematological: Does not bruise/bleed easily.    Allergies  Sulfa antibiotics  Home Medications   Current Outpatient Rx  Name Route Sig Dispense Refill  . OMEPRAZOLE 40 MG PO CPDR Oral Take 40 mg by mouth at bedtime.    Marland Kitchen DOXYCYCLINE HYCLATE 100 MG PO CAPS Oral Take 1 capsule (100 mg total) by mouth 2 (two) times daily. 14 capsule 0  . ONDANSETRON 4 MG PO TBDP Oral Take 1 tablet (4 mg total) by mouth every 8 (eight) hours as needed for nausea. 10 tablet 0    BP 108/64  Pulse 78  Temp(Src) 98.6 F (37 C) (Oral)  Resp 20  Ht 5\' 4"  (1.626 m)  Wt 136 lb (61.689 kg)  BMI 23.34 kg/m2  SpO2 99%  Physical Exam  Nursing note and vitals reviewed. Constitutional: She is oriented to person, place, and time. She appears well-developed and well-nourished. No distress.  HENT:  Head: Normocephalic and atraumatic.  Mouth/Throat: Oropharynx is clear and moist.  Eyes: Conjunctivae and EOM are normal. Pupils are equal, round, and reactive to light.  Neck: Normal range of motion. Neck supple.  Cardiovascular: Normal rate, regular rhythm, normal heart sounds and intact distal pulses.   No murmur heard. Pulmonary/Chest: Effort normal and breath sounds normal. No respiratory distress. She has no wheezes. She has no rales.  Abdominal: Soft. Bowel sounds are normal. There is no tenderness.  Musculoskeletal: Normal range of motion. She exhibits tenderness.       Normal except for right middle finger with distal subungual hematoma old swelling of the finger some redness some increased warmth limited range of motion at the DIPs and PIPs but  able to move it some. Sensation intact. Cap refill is normal. Also some mild swelling of the right hand. Movement of the fingers without any significant increase in pain.  Neurological: She is alert and oriented to person, place, and time. No cranial nerve deficit. She exhibits normal muscle tone. Coordination normal.  Skin: Skin is warm. No rash noted. There is erythema.    ED Course  Procedures (including critical care time)  Labs Reviewed  CBC - Abnormal; Notable for the following:    RBC 3.79 (*)    Hemoglobin 11.6 (*)    HCT 34.0 (*)    Platelets 117 (*)    All other components within normal limits  BASIC METABOLIC PANEL - Abnormal; Notable for the following:    Sodium 132 (*)    Glucose, Bld 125 (*)    GFR calc non Af Amer 85 (*)    All other components within normal limits   Dg Hand Complete Right  03/13/2011  *RADIOLOGY REPORT*  Clinical Data: Pain, erythema, swelling and blackened nail bed for 1 day.  No acute injury.  RIGHT HAND - COMPLETE 3+ VIEW  Comparison: None.  Findings: There is mild diffuse osteopenia.  Scattered mild interphalangeal and intercarpal degenerative changes are present. 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  No acute fracture, dislocation or bone destruction is evident.  The soft tissues are diffusely prominent without apparent focal swelling.  IMPRESSION: No acute osseous findings, focal soft tissue swelling or bone destruction identified.  Original Report Authenticated By: Gerrianne Scale, M.D.     1. Finger injury   2. Viral illness       MDM   Patient presents with 2 concerns first sounds like a viral illness with nausea and diarrhea and headache recently exposed to her daughter that had the flu. No vomiting. Does have some bodyaches but no cough no congestion.  A second concern is for the right middle finger which patient thinks she dropped something on it has no recollection of when it was injured finger looks as if it  was slammed in a door. The distal part of the finger has a cold some ungual hematoma good chance that'll be loss of the nail bed on that side finger is a little warm and swollen some question of cellulitis patient given IV vancomycin in the ED for that and will be sent home with doxycycline.  Important the patient had the hand we looked at again in the next 48 hours she can followup here or with her primary care doctor Dr. Sudie Bailey.        Shelda Jakes, MD 03/13/11 [phone]

## 2011-03-13 NOTE — ED Notes (Signed)
Pt  Back from x ray dept.

## 2011-03-13 NOTE — ED Notes (Signed)
Pt presents with n/d, body aches, and headache (across sinuses) that started today. Pt also presents with injury to right middle finger. Tip of finger is black, swollen and painful. Pt does not know how injury occurred.

## 2011-03-14 ENCOUNTER — Encounter (HOSPITAL_COMMUNITY): Payer: Self-pay | Admitting: *Deleted

## 2011-03-14 ENCOUNTER — Other Ambulatory Visit: Payer: Self-pay

## 2011-03-14 ENCOUNTER — Emergency Department (HOSPITAL_COMMUNITY): Payer: Medicare Other

## 2011-03-14 ENCOUNTER — Encounter (HOSPITAL_COMMUNITY): Payer: Self-pay | Admitting: Anesthesiology

## 2011-03-14 ENCOUNTER — Encounter (HOSPITAL_COMMUNITY)
Admission: EM | Disposition: A | Discharge status: Home / Self Care | Payer: Self-pay | Source: Home / Self Care | Attending: Internal Medicine

## 2011-03-14 ENCOUNTER — Inpatient Hospital Stay (HOSPITAL_COMMUNITY)
Admission: EM | Admit: 2011-03-14 | Discharge: 2011-03-16 | DRG: 580 | Disposition: A | Discharge status: Home / Self Care | Payer: Medicare Other | Attending: Internal Medicine | Admitting: Internal Medicine

## 2011-03-14 ENCOUNTER — Inpatient Hospital Stay (HOSPITAL_COMMUNITY): Payer: Medicare Other | Admitting: Anesthesiology

## 2011-03-14 DIAGNOSIS — E871 Hypo-osmolality and hyponatremia: Secondary | ICD-10-CM | POA: Diagnosis present

## 2011-03-14 DIAGNOSIS — Z8612 Personal history of poliomyelitis: Secondary | ICD-10-CM

## 2011-03-14 DIAGNOSIS — E669 Obesity, unspecified: Secondary | ICD-10-CM | POA: Diagnosis present

## 2011-03-14 DIAGNOSIS — Z860100 Personal history of colon polyps, unspecified: Secondary | ICD-10-CM

## 2011-03-14 DIAGNOSIS — Z79899 Other long term (current) drug therapy: Secondary | ICD-10-CM

## 2011-03-14 DIAGNOSIS — L03019 Cellulitis of unspecified finger: Principal | ICD-10-CM | POA: Diagnosis present

## 2011-03-14 DIAGNOSIS — L02519 Cutaneous abscess of unspecified hand: Principal | ICD-10-CM | POA: Diagnosis present

## 2011-03-14 DIAGNOSIS — H5440 Blindness, one eye, unspecified eye: Secondary | ICD-10-CM | POA: Diagnosis present

## 2011-03-14 DIAGNOSIS — K219 Gastro-esophageal reflux disease without esophagitis: Secondary | ICD-10-CM | POA: Diagnosis present

## 2011-03-14 DIAGNOSIS — A4901 Methicillin susceptible Staphylococcus aureus infection, unspecified site: Secondary | ICD-10-CM | POA: Diagnosis present

## 2011-03-14 DIAGNOSIS — L03113 Cellulitis of right upper limb: Secondary | ICD-10-CM

## 2011-03-14 DIAGNOSIS — M65839 Other synovitis and tenosynovitis, unspecified forearm: Secondary | ICD-10-CM | POA: Diagnosis present

## 2011-03-14 DIAGNOSIS — IMO0001 Reserved for inherently not codable concepts without codable children: Secondary | ICD-10-CM

## 2011-03-14 DIAGNOSIS — Z882 Allergy status to sulfonamides status: Secondary | ICD-10-CM

## 2011-03-14 DIAGNOSIS — R509 Fever, unspecified: Secondary | ICD-10-CM | POA: Diagnosis present

## 2011-03-14 DIAGNOSIS — M65849 Other synovitis and tenosynovitis, unspecified hand: Secondary | ICD-10-CM | POA: Diagnosis present

## 2011-03-14 DIAGNOSIS — G5600 Carpal tunnel syndrome, unspecified upper limb: Secondary | ICD-10-CM | POA: Diagnosis present

## 2011-03-14 HISTORY — PX: I&D EXTREMITY: SHX5045

## 2011-03-14 LAB — COMPREHENSIVE METABOLIC PANEL
Albumin: 3.8 g/dL (ref 3.5–5.2)
Alkaline Phosphatase: 77 U/L (ref 39–117)
BUN: 10 mg/dL (ref 6–23)
Chloride: 96 mEq/L (ref 96–112)
Glucose, Bld: 131 mg/dL — ABNORMAL HIGH (ref 70–99)
Potassium: 3.7 mEq/L (ref 3.5–5.1)
Total Bilirubin: 0.8 mg/dL (ref 0.3–1.2)

## 2011-03-14 LAB — DIFFERENTIAL
Basophils Relative: 0 % (ref 0–1)
Lymphocytes Relative: 13 % (ref 12–46)
Lymphs Abs: 1.6 10*3/uL (ref 0.7–4.0)
Monocytes Relative: 9 % (ref 3–12)
Neutro Abs: 10.3 10*3/uL — ABNORMAL HIGH (ref 1.7–7.7)
Neutrophils Relative %: 79 % — ABNORMAL HIGH (ref 43–77)

## 2011-03-14 LAB — CBC
Hemoglobin: 11.4 g/dL — ABNORMAL LOW (ref 12.0–15.0)
RBC: 3.7 MIL/uL — ABNORMAL LOW (ref 3.87–5.11)
WBC: 13.1 10*3/uL — ABNORMAL HIGH (ref 4.0–10.5)

## 2011-03-14 LAB — GRAM STAIN

## 2011-03-14 LAB — CULTURE, ROUTINE-ABSCESS

## 2011-03-14 SURGERY — IRRIGATION AND DEBRIDEMENT EXTREMITY
Anesthesia: General | Site: Finger | Laterality: Right | Wound class: Dirty or Infected

## 2011-03-14 MED ORDER — PIPERACILLIN-TAZOBACTAM 3.375 G IVPB
3.3750 g | Freq: Once | INTRAVENOUS | Status: AC
  Administered 2011-03-14 (×2): 3.375 g via INTRAVENOUS
  Filled 2011-03-14: qty 50

## 2011-03-14 MED ORDER — PROPOFOL 10 MG/ML IV EMUL
INTRAVENOUS | Status: DC | PRN
  Administered 2011-03-14: 110 mg via INTRAVENOUS

## 2011-03-14 MED ORDER — VECURONIUM BROMIDE 10 MG IV SOLR
INTRAVENOUS | Status: DC | PRN
  Administered 2011-03-14: 4 mg via INTRAVENOUS

## 2011-03-14 MED ORDER — ONDANSETRON HCL 4 MG PO TABS
4.0000 mg | ORAL_TABLET | Freq: Four times a day (QID) | ORAL | Status: DC | PRN
  Administered 2011-03-15: 4 mg via ORAL
  Filled 2011-03-14: qty 1

## 2011-03-14 MED ORDER — SUCCINYLCHOLINE CHLORIDE 20 MG/ML IJ SOLN
INTRAMUSCULAR | Status: DC | PRN
  Administered 2011-03-14: 100 mg via INTRAVENOUS

## 2011-03-14 MED ORDER — HYDROMORPHONE HCL PF 1 MG/ML IJ SOLN: 0.2500 mg | INTRAMUSCULAR | Status: DC | PRN

## 2011-03-14 MED ORDER — VANCOMYCIN HCL IN DEXTROSE 1-5 GM/200ML-% IV SOLN
1000.0000 mg | INTRAVENOUS | Status: DC
  Administered 2011-03-15 – 2011-03-16 (×2): 1000 mg via INTRAVENOUS
  Filled 2011-03-14 (×2): qty 200

## 2011-03-14 MED ORDER — HYDROMORPHONE HCL PF 1 MG/ML IJ SOLN
0.5000 mg | INTRAMUSCULAR | Status: DC | PRN
  Administered 2011-03-15 – 2011-03-16 (×4): 1 mg via INTRAVENOUS
  Filled 2011-03-14 (×4): qty 1

## 2011-03-14 MED ORDER — ONDANSETRON HCL 4 MG/2ML IJ SOLN: 4.0000 mg | INTRAMUSCULAR | Status: DC | PRN

## 2011-03-14 MED ORDER — SODIUM CHLORIDE 0.9 % IR SOLN
Status: DC | PRN
  Administered 2011-03-14: 6000 mL

## 2011-03-14 MED ORDER — SODIUM CHLORIDE 0.9 % IV BOLUS (SEPSIS)
1000.0000 mL | Freq: Once | INTRAVENOUS | Status: AC
  Administered 2011-03-14: 1000 mL via INTRAVENOUS

## 2011-03-14 MED ORDER — PIPERACILLIN-TAZOBACTAM 3.375 G IVPB 30 MIN: INTRAVENOUS | Status: DC | PRN

## 2011-03-14 MED ORDER — SODIUM CHLORIDE 0.9 % IV SOLN: INTRAVENOUS | Status: DC

## 2011-03-14 MED ORDER — PIPERACILLIN-TAZOBACTAM 3.375 G IVPB
3.3750 g | Freq: Three times a day (TID) | INTRAVENOUS | Status: DC
  Administered 2011-03-15 – 2011-03-16 (×5): 3.375 g via INTRAVENOUS
  Filled 2011-03-14 (×8): qty 50

## 2011-03-14 MED ORDER — MEPERIDINE HCL 25 MG/ML IJ SOLN: 6.2500 mg | INTRAMUSCULAR | Status: DC | PRN

## 2011-03-14 MED ORDER — ONDANSETRON HCL 4 MG/2ML IJ SOLN
4.0000 mg | Freq: Once | INTRAMUSCULAR | Status: AC
  Administered 2011-03-14: 4 mg via INTRAVENOUS
  Filled 2011-03-14: qty 2

## 2011-03-14 MED ORDER — MORPHINE SULFATE 4 MG/ML IJ SOLN
4.0000 mg | Freq: Once | INTRAMUSCULAR | Status: AC
  Administered 2011-03-14: 4 mg via INTRAVENOUS
  Filled 2011-03-14: qty 1

## 2011-03-14 MED ORDER — ONDANSETRON HCL 4 MG/2ML IJ SOLN
4.0000 mg | Freq: Four times a day (QID) | INTRAMUSCULAR | Status: DC | PRN
  Administered 2011-03-14: 4 mg via INTRAVENOUS
  Filled 2011-03-14: qty 2

## 2011-03-14 MED ORDER — FENTANYL CITRATE 0.05 MG/ML IJ SOLN
INTRAMUSCULAR | Status: DC | PRN
  Administered 2011-03-14: 150 ug via INTRAVENOUS
  Administered 2011-03-14: 50 ug via INTRAVENOUS
  Administered 2011-03-14: 100 ug via INTRAVENOUS

## 2011-03-14 MED ORDER — ONDANSETRON HCL 4 MG/2ML IJ SOLN: 4.0000 mg | Freq: Four times a day (QID) | INTRAMUSCULAR | Status: DC | PRN

## 2011-03-14 MED ORDER — ACETAMINOPHEN 650 MG RE SUPP: 650.0000 mg | Freq: Four times a day (QID) | RECTAL | Status: DC | PRN

## 2011-03-14 MED ORDER — MORPHINE SULFATE 4 MG/ML IJ SOLN
4.0000 mg | INTRAMUSCULAR | Status: DC | PRN
  Administered 2011-03-14: 4 mg via INTRAVENOUS

## 2011-03-14 MED ORDER — MORPHINE SULFATE 2 MG/ML IJ SOLN: 0.0500 mg/kg | INTRAMUSCULAR | Status: DC | PRN

## 2011-03-14 MED ORDER — PIPERACILLIN-TAZOBACTAM 3.375 G IVPB
3.3750 g | INTRAVENOUS | Status: AC
  Filled 2011-03-14: qty 50

## 2011-03-14 MED ORDER — MORPHINE SULFATE 4 MG/ML IJ SOLN: 4.0000 mg | INTRAMUSCULAR | Status: DC | PRN

## 2011-03-14 MED ORDER — LACTATED RINGERS IV SOLN
INTRAVENOUS | Status: DC | PRN
  Administered 2011-03-14 (×2): via INTRAVENOUS

## 2011-03-14 MED ORDER — MIDAZOLAM HCL 5 MG/5ML IJ SOLN
INTRAMUSCULAR | Status: DC | PRN
  Administered 2011-03-14: 2 mg via INTRAVENOUS

## 2011-03-14 MED ORDER — VANCOMYCIN HCL IN DEXTROSE 1-5 GM/200ML-% IV SOLN
1000.0000 mg | Freq: Once | INTRAVENOUS | Status: AC
  Administered 2011-03-14: 1000 mg via INTRAVENOUS
  Filled 2011-03-14: qty 200

## 2011-03-14 MED ORDER — MORPHINE SULFATE 4 MG/ML IJ SOLN
1.0000 mg | INTRAMUSCULAR | Status: DC | PRN
  Filled 2011-03-14: qty 1

## 2011-03-14 MED ORDER — BUPIVACAINE HCL (PF) 0.25 % IJ SOLN
INTRAMUSCULAR | Status: DC | PRN
  Administered 2011-03-14: 10 mL

## 2011-03-14 MED ORDER — LIDOCAINE HCL (CARDIAC) 20 MG/ML IV SOLN
INTRAVENOUS | Status: DC | PRN
  Administered 2011-03-14: 100 mg via INTRAVENOUS

## 2011-03-14 MED ORDER — ACETAMINOPHEN 325 MG PO TABS
650.0000 mg | ORAL_TABLET | Freq: Four times a day (QID) | ORAL | Status: DC | PRN
  Administered 2011-03-15: 650 mg via ORAL
  Filled 2011-03-14: qty 2

## 2011-03-14 MED ORDER — ONDANSETRON HCL 4 MG/2ML IJ SOLN: 4.0000 mg | Freq: Once | INTRAMUSCULAR | Status: AC | PRN

## 2011-03-14 MED ORDER — GLYCOPYRROLATE 0.2 MG/ML IJ SOLN
INTRAMUSCULAR | Status: DC | PRN
  Administered 2011-03-14: .6 mg via INTRAVENOUS

## 2011-03-14 MED ORDER — NEOSTIGMINE METHYLSULFATE 1 MG/ML IJ SOLN
INTRAMUSCULAR | Status: DC | PRN
  Administered 2011-03-14: 5 mg via INTRAVENOUS

## 2011-03-14 MED ORDER — ONDANSETRON HCL 4 MG/2ML IJ SOLN
INTRAMUSCULAR | Status: DC | PRN
  Administered 2011-03-14: 4 mg via INTRAVENOUS

## 2011-03-14 SURGICAL SUPPLY — 54 items
BANDAGE ACE 4 STERILE (GAUZE/BANDAGES/DRESSINGS) ×2 IMPLANT
BANDAGE COBAN STERILE 2 (GAUZE/BANDAGES/DRESSINGS) IMPLANT
BANDAGE CONFORM 2  STR LF (GAUZE/BANDAGES/DRESSINGS) IMPLANT
BANDAGE ELASTIC 3 VELCRO ST LF (GAUZE/BANDAGES/DRESSINGS) ×2 IMPLANT
BANDAGE ELASTIC 4 VELCRO ST LF (GAUZE/BANDAGES/DRESSINGS) ×2 IMPLANT
BANDAGE GAUZE ELAST BULKY 4 IN (GAUZE/BANDAGES/DRESSINGS) ×2 IMPLANT
BNDG COHESIVE 1X5 TAN STRL LF (GAUZE/BANDAGES/DRESSINGS) IMPLANT
BNDG ESMARK 4X9 LF (GAUZE/BANDAGES/DRESSINGS) IMPLANT
CLOTH BEACON ORANGE TIMEOUT ST (SAFETY) IMPLANT
CORDS BIPOLAR (ELECTRODE) ×2 IMPLANT
COVER SURGICAL LIGHT HANDLE (MISCELLANEOUS) ×2 IMPLANT
DECANTER SPIKE VIAL GLASS SM (MISCELLANEOUS) IMPLANT
DRAIN PENROSE 1/4X12 LTX STRL (WOUND CARE) IMPLANT
DRSG ADAPTIC 3X8 NADH LF (GAUZE/BANDAGES/DRESSINGS) IMPLANT
DRSG EMULSION OIL 3X3 NADH (GAUZE/BANDAGES/DRESSINGS) ×2 IMPLANT
DRSG PAD ABDOMINAL 8X10 ST (GAUZE/BANDAGES/DRESSINGS) ×4 IMPLANT
GAUZE KERLIX 2  STERILE LF (GAUZE/BANDAGES/DRESSINGS) ×2 IMPLANT
GAUZE PACKING IODOFORM 1/4X5 (PACKING) ×2 IMPLANT
GAUZE XEROFORM 1X8 LF (GAUZE/BANDAGES/DRESSINGS) ×2 IMPLANT
GAUZE XEROFORM 5X9 LF (GAUZE/BANDAGES/DRESSINGS) ×2 IMPLANT
GLOVE BIO SURGEON STRL SZ7.5 (GLOVE) ×2 IMPLANT
GLOVE BIOGEL PI IND STRL 8 (GLOVE) ×1 IMPLANT
GLOVE BIOGEL PI INDICATOR 8 (GLOVE) ×1
GOWN STRL REIN XL XLG (GOWN DISPOSABLE) ×2 IMPLANT
HANDPIECE INTERPULSE COAX TIP (DISPOSABLE)
KIT BASIN OR (CUSTOM PROCEDURE TRAY) ×2 IMPLANT
KIT ROOM TURNOVER OR (KITS) ×2 IMPLANT
LOOP VESSEL MAXI BLUE (MISCELLANEOUS) IMPLANT
LOOP VESSEL MINI RED (MISCELLANEOUS) IMPLANT
MANIFOLD NEPTUNE II (INSTRUMENTS) ×2 IMPLANT
NEEDLE HYPO 25X1 1.5 SAFETY (NEEDLE) IMPLANT
NS IRRIG 1000ML POUR BTL (IV SOLUTION) ×2 IMPLANT
PACK ORTHO EXTREMITY (CUSTOM PROCEDURE TRAY) ×2 IMPLANT
PAD ARMBOARD 7.5X6 YLW CONV (MISCELLANEOUS) ×4 IMPLANT
PADDING WEBRIL 4 STERILE (GAUZE/BANDAGES/DRESSINGS) ×2 IMPLANT
SCRUB BETADINE 4OZ XXX (MISCELLANEOUS) IMPLANT
SET HNDPC FAN SPRY TIP SCT (DISPOSABLE) IMPLANT
SOLUTION BETADINE 4OZ (MISCELLANEOUS) IMPLANT
SPONGE GAUZE 4X4 12PLY (GAUZE/BANDAGES/DRESSINGS) ×2 IMPLANT
SPONGE LAP 18X18 X RAY DECT (DISPOSABLE) IMPLANT
SPONGE LAP 4X18 X RAY DECT (DISPOSABLE) ×2 IMPLANT
SUCTION FRAZIER TIP 10 FR DISP (SUCTIONS) ×2 IMPLANT
SUT ETHILON 4 0 PS 2 18 (SUTURE) ×4 IMPLANT
SUT MON AB 5-0 P3 18 (SUTURE) IMPLANT
SWAB COLLECTION DEVICE MRSA (MISCELLANEOUS) ×2 IMPLANT
SYR CONTROL 10ML LL (SYRINGE) IMPLANT
TOWEL OR 17X24 6PK STRL BLUE (TOWEL DISPOSABLE) ×2 IMPLANT
TOWEL OR 17X26 10 PK STRL BLUE (TOWEL DISPOSABLE) ×2 IMPLANT
TUBE ANAEROBIC SPECIMEN COL (MISCELLANEOUS) ×4 IMPLANT
TUBE CONNECTING 12X1/4 (SUCTIONS) ×2 IMPLANT
TUBE FEEDING 5FR 15 INCH (TUBING) ×2 IMPLANT
UNDERPAD 30X30 INCONTINENT (UNDERPADS AND DIAPERS) ×2 IMPLANT
WATER STERILE IRR 1000ML POUR (IV SOLUTION) ×2 IMPLANT
YANKAUER SUCT BULB TIP NO VENT (SUCTIONS) IMPLANT

## 2011-03-14 NOTE — ED Provider Notes (Addendum)
Scribed for Ward Givens, MD, the patient was seen in room [facility] . This chart was scribed by Ellie Lunch.   CSN: [id number]  Arrival date & time 03/14/11  1123   First MD Initiated Contact with Patient 03/14/11 1208      Chief Complaint  Patient presents with  . Wound Infection    (Consider location/radiation/quality/duration/timing/severity/associated sxs/prior treatment) HPI Tanya Ellis is a 71 y.o. female who presents to the Emergency Department complaining of wound infection. Pt seen in ED yesterday for swelling in her right middle finger and treated with IV antibiotics. Pt reports finger has rapidly worsened since yesterday. Swelling in finger began initially ~2 days ago. Pt is unsure what caused the swelling and states she probably "mashed it" and did not notice because of decreased sensation in fingers due to carpal tunnel. Pt complains of associated nausea, vomiting, and chills. Pt denies fever. Movement of the finger palpation makes the pain worse. Nothing makes it feel better. Pt has had blood poisoning in the past when she was 71 years old.   Pt received IV vancomycin last night and oral doxycycline which she states makes her have nausea and vomiting.    PCP Dr. Sudie Bailey.  Pt has never seen an orthopedist in past.   Past Medical History  Diagnosis Date  . [facility] spotted fever   . Lyme disease   . Polio   . Measles   . Mumps   . GERD (gastroesophageal reflux disease)   . Sliding hiatal hernia   . Headache   . Uveitis   . Blind right eye   . Colon polyp     Past Surgical History  Procedure Date  . Back surgery   . Tonsillectomy   . Adenoidectomy     History reviewed. No pertinent family history.  History  Substance Use Topics  . Smoking status: Never Smoker   . Smokeless tobacco: Not on file  . Alcohol Use: No  Lives with disabled son.    Review of Systems  All other systems reviewed and are negative.    Allergies  Sulfa  antibiotics  Home Medications   Current Outpatient Rx  Name Route Sig Dispense Refill  . DOXYCYCLINE HYCLATE 100 MG PO CAPS Oral Take 1 capsule (100 mg total) by mouth 2 (two) times daily. 14 capsule 0  . HYDROCODONE-ACETAMINOPHEN 5-325 MG PO TABS Oral Take 1 tablet by mouth every 6 (six) hours as needed for pain. 10 tablet 0  . OMEPRAZOLE 40 MG PO CPDR Oral Take 40 mg by mouth at bedtime.    Marland Kitchen ONDANSETRON 4 MG PO TBDP Oral Take 1 tablet (4 mg total) by mouth every 8 (eight) hours as needed for nausea. 10 tablet 0    BP 114/74  Pulse 77  Temp(Src) 97.9 F (36.6 C) (Oral)  Resp 20  Ht 5\' 4"  (1.626 m)  Wt 136 lb (61.689 kg)  BMI 23.34 kg/m2  SpO2 99%  Vital signs normal   Physical Exam  Nursing note and vitals reviewed. Constitutional: She is oriented to person, place, and time. She appears well-developed and well-nourished.  Non-toxic appearance. She does not appear ill. No distress.       Patient appears a little anxious  HENT:  Head: Normocephalic and atraumatic.  Right Ear: External ear normal.  Left Ear: External ear normal.  Nose: Nose normal. No mucosal edema or rhinorrhea.  Mouth/Throat: Oropharynx is clear and moist and mucous membranes are normal. No dental abscesses  or uvula swelling.  Eyes: Conjunctivae and EOM are normal. Pupils are equal, round, and reactive to light.  Neck: Normal range of motion and full passive range of motion without pain. Neck supple.  Pulmonary/Chest: Effort normal. No respiratory distress. She has no rhonchi. She exhibits no crepitus.  Abdominal: Normal appearance.  Musculoskeletal: Normal range of motion. She exhibits no edema and no tenderness.       Patient is noted to have a large blister on her distal right middle finger is black in color and starts around her nailbed and extends to the PIP joint. She's also noted to have marked swelling and redness of the dorsum of her hand that extends up to the level of the wrist. She then has a faint  red line seen on the radial aspect of her distal forearm. They do not feel lymphadenopathy in her axilla however she states it's tender to palpation there. Radial pulse intact  Neurological: She is alert and oriented to person, place, and time. She has normal strength. No cranial nerve deficit.  Skin: Skin is warm, dry and intact. No rash noted. No erythema. No pallor.  Psychiatric: She has a normal mood and affect. Her speech is normal and behavior is normal. Her mood appears not anxious.    ED Course  Procedures (including critical care time)  Results for orders placed during the hospital encounter of 03/14/11  CBC      Component Value Range   WBC 13.1 (*) 4.0 - 10.5 (K/uL)   RBC 3.70 (*) 3.87 - 5.11 (MIL/uL)   Hemoglobin 11.4 (*) 12.0 - 15.0 (g/dL)   HCT 16.1 (*) 09.6 - 46.0 (%)   MCV 89.5  78.0 - 100.0 (fL)   MCH 30.8  26.0 - 34.0 (pg)   MCHC 34.4  30.0 - 36.0 (g/dL)   RDW 04.5  40.9 - 81.1 (%)   Platelets 114 (*) 150 - 400 (K/uL)  DIFFERENTIAL      Component Value Range   Neutrophils Relative 79 (*) 43 - 77 (%)   Neutro Abs 10.3 (*) 1.7 - 7.7 (K/uL)   Lymphocytes Relative 13  12 - 46 (%)   Lymphs Abs 1.6  0.7 - 4.0 (K/uL)   Monocytes Relative 9  3 - 12 (%)   Monocytes Absolute 1.1 (*) 0.1 - 1.0 (K/uL)   Eosinophils Relative 0  0 - 5 (%)   Eosinophils Absolute 0.0  0.0 - 0.7 (K/uL)   Basophils Relative 0  0 - 1 (%)   Basophils Absolute 0.0  0.0 - 0.1 (K/uL)  COMPREHENSIVE METABOLIC PANEL      Component Value Range   Sodium 131 (*) 135 - 145 (mEq/L)   Potassium 3.7  3.5 - 5.1 (mEq/L)   Chloride 96  96 - 112 (mEq/L)   CO2 26  19 - 32 (mEq/L)   Glucose, Bld 131 (*) 70 - 99 (mg/dL)   BUN 10  6 - 23 (mg/dL)   Creatinine, Ser 9.14  0.50 - 1.10 (mg/dL)   Calcium 9.4  8.4 - 78.2 (mg/dL)   Total Protein 6.7  6.0 - 8.3 (g/dL)   Albumin 3.8  3.5 - 5.2 (g/dL)   AST 18  0 - 37 (U/L)   ALT 14  0 - 35 (U/L)   Alkaline Phosphatase 77  39 - 117 (U/L)   Total Bilirubin 0.8  0.3 -  1.2 (mg/dL)   GFR calc non Af Amer 83 (*) >90 (mL/min)   GFR calc  Af Amer >90  >90 (mL/min)   Laboratory interpretation all normal except leukocytosis and mild hyponatremia  Dg Hand Complete Right  03/13/2011  *RADIOLOGY REPORT*  Clinical Data: Pain, erythema, swelling and blackened nail bed for 1 day.  No acute injury.  RIGHT HAND - COMPLETE 3+ VIEW  Comparison: None.  Findings: There is mild diffuse osteopenia.  Scattered mild interphalangeal and intercarpal degenerative changes are present. 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  No acute fracture, dislocation or bone destruction is evident.  The soft tissues are diffusely prominent without apparent focal swelling.  IMPRESSION: No acute osseous findings, focal soft tissue swelling or bone destruction identified.  Original Report Authenticated By: Gerrianne Scale, M.D.    Date: 03/14/2011  Rate: 84  Rhythm: normal sinus rhythm  QRS Axis: normal  Intervals: normal  ST/T Wave abnormalities: normal  Conduction Disutrbances:none  Narrative Interpretation:   Old EKG Reviewed: none available     ED MEDICATION Medications  0.9 %  sodium chloride infusion   vancomycin (VANCOCIN) IVPB 1000 mg/200 mL premix   piperacillin-tazobactam (ZOSYN) IVPB 3.375 g (3.375 g Intravenous Given 03/14/11 1334)  sodium chloride 0.9 % bolus 1,000 mL (1000 mL Intravenous Given 03/14/11 1329)  morphine 4 MG/ML injection 4 mg (4 mg Intravenous Given 03/14/11 1331)  ondansetron (ZOFRAN) injection 4 mg (4 mg Intravenous Given 03/14/11 1330)   14:38 Dr Mina Marble states to have medicine admit and he will take to OR.  15:10 Dr Tristan Schroeder accepts in transfer for admission to med-surg team 6  Diagnoses that have been ruled out:  None  Diagnoses that are still under consideration:  None  Final diagnoses:  Cellulitis of right hand  Abscess of third finger of right hand     Plan transfer to [facility] for admission   MDM   I personally performed  the services described in this documentation, which was scribed in my presence. The recorded information has been reviewed and considered. Devoria Albe, MD, FACEP      Ward Givens, MD 03/14/11 1525  Ward Givens, MD 03/14/11 [phone]

## 2011-03-14 NOTE — Anesthesia Preprocedure Evaluation (Addendum)
Anesthesia Evaluation  Patient identified by MRN, date of birth, ID band Patient awake    Reviewed: Allergy & Precautions, H&P , NPO status , Patient's Chart, lab work & pertinent test results  Airway Mallampati: I TM Distance: >3 FB Neck ROM: full    Dental  (+) Upper Dentures, Missing and Poor Dentition   Pulmonary  clear to auscultation        Cardiovascular neg cardio ROS     Neuro/Psych  Headaches,  Neuromuscular disease Negative Psych ROS   GI/Hepatic Neg liver ROS, hiatal hernia, GERD-  Medicated,  Endo/Other  Negative Endocrine ROS  Renal/GU negative Renal ROS  Genitourinary negative   Musculoskeletal   Abdominal   Peds  Hematology negative hematology ROS (+)   Anesthesia Other Findings   Reproductive/Obstetrics negative OB ROS                          Anesthesia Physical Anesthesia Plan  ASA: III and Emergent  Anesthesia Plan: General   Post-op Pain Management:    Induction: Intravenous, Rapid sequence and Cricoid pressure planned  Airway Management Planned: Oral ETT  Additional Equipment:   Intra-op Plan:   Post-operative Plan: Extubation in OR  Informed Consent: I have reviewed the patients History and Physical, chart, labs and discussed the procedure including the risks, benefits and alternatives for the proposed anesthesia with the patient or authorized representative who has indicated his/her understanding and acceptance.     Plan Discussed with: CRNA and Surgeon  Anesthesia Plan Comments:         Anesthesia Quick Evaluation

## 2011-03-14 NOTE — Anesthesia Postprocedure Evaluation (Signed)
Anesthesia Post Note  Patient: Tanya Ellis  Procedure(s) Performed:  IRRIGATION AND DEBRIDEMENT EXTREMITY  Anesthesia type: general  Patient location: PACU  Post pain: Pain level controlled  Post assessment: Patient's Cardiovascular Status Stable  Last Vitals:  Filed Vitals:   03/14/11 1829  BP: 115/69  Pulse: 93  Temp: 38.5 C  Resp: 18    Post vital signs: Reviewed and stable  Level of consciousness: sedated  Complications: No apparent anesthesia complications

## 2011-03-14 NOTE — Progress Notes (Signed)
ANTIBIOTIC CONSULT NOTE - INITIAL  Pharmacy Consult for Vancomycin & Zosyn Indication: cellulits  Allergies  Allergen Reactions  . Sulfa Antibiotics     Patient Measurements: Height: 5\' 4"  (162.6 cm) Weight: 136 lb (61.689 kg) IBW/kg (Calculated) : 54.7   Vital Signs: Temp: 101.3 F (38.5 C) (01/20 1829) Temp src: Oral (01/20 1829) BP: 115/69 mmHg (01/20 1829) Pulse Rate: 93  (01/20 1829) Intake/Output from previous day:   Intake/Output from this shift:    Labs:  Basename 03/14/11 1305 03/13/11 1424  WBC 13.1* 8.1  HGB 11.4* 11.6*  PLT 114* 117*  LABCREA -- --  CREATININE 0.73 0.68   Estimated Creatinine Clearance: 54.9 ml/min (by C-G formula based on Cr of 0.73). No results found for this basename: VANCOTROUGH:2,VANCOPEAK:2,VANCORANDOM:2,GENTTROUGH:2,GENTPEAK:2,GENTRANDOM:2,TOBRATROUGH:2,TOBRAPEAK:2,TOBRARND:2,AMIKACINPEAK:2,AMIKACINTROU:2,AMIKACIN:2, in the last 72 hours   Microbiology: No results found for this or any previous visit (from the past 720 hour(s)).  Medical History: Past Medical History  Diagnosis Date  . [facility] spotted fever   . Lyme disease   . Polio   . Measles   . Mumps   . GERD (gastroesophageal reflux disease)   . Sliding hiatal hernia   . Headache   . Uveitis   . Blind right eye   . Colon polyp     Medications:  Prescriptions prior to admission  Medication Sig Dispense Refill  . doxycycline (VIBRAMYCIN) 100 MG capsule Take 1 capsule (100 mg total) by mouth 2 (two) times daily.  14 capsule  0  . HYDROcodone-acetaminophen (NORCO) 5-325 MG per tablet Take 1 tablet by mouth every 6 (six) hours as needed for pain.  10 tablet  0  . omeprazole (PRILOSEC) 40 MG capsule Take 40 mg by mouth at bedtime.      . ondansetron (ZOFRAN ODT) 4 MG disintegrating tablet Take 1 tablet (4 mg total) by mouth every 8 (eight) hours as needed for nausea.  10 tablet  0   Scheduled:    .  morphine injection  4 mg Intravenous Once  . ondansetron  (ZOFRAN) IV  4 mg Intravenous Once  . piperacillin-tazobactam (ZOSYN)  IV  3.375 g Intravenous Once  . piperacillin-tazobactam (ZOSYN)  IV  3.375 g Intravenous Q8H  . sodium chloride  1,000 mL Intravenous Once  . vancomycin  1,000 mg Intravenous Once  . vancomycin  1,000 mg Intravenous Q24H   Assessment: Pt returned to [facility] today with worsened celluliits of 3rd finger after being seen there yesterday (03/13/11) and given 1 dose IV vancomycin & prescription for po doxycycline.  Today her IV antibiotics were broadened to Vanc & Zosyn with plans to have I&D.  Pt's WBC remains elevated and she is still febrile at 101.67F.  Medications will be adjusted for renal function.    Goal of Therapy:  Vancomycin trough level 10-15 mcg/ml  Plan:  1) Vancomycin 1gm IV q24h 2) Zosyn 3.375gm IV q8h infused over 4 hrs 3) F/U renal function and cx data 4) check vancomycin trough at steady state if plan to continue > 5 days  Elson Clan 03/14/2011,8:24 PM

## 2011-03-14 NOTE — ED Notes (Signed)
Swelling noted to patient's right middle finger and right hand.  Large area of purple discoloration noted from tip to mid-finger of third digit.  Pt reports throbbing and burning pain.  Redness noted just past knuckles of right hand.

## 2011-03-14 NOTE — Transfer of Care (Signed)
Immediate Anesthesia Transfer of Care Note  Patient: Tanya Ellis  Procedure(s) Performed:  IRRIGATION AND DEBRIDEMENT EXTREMITY  Patient Location: PACU  Anesthesia Type: General  Level of Consciousness: oriented, sedated, patient cooperative and responds to stimulation  Airway & Oxygen Therapy: Patient Spontanous Breathing and Patient connected to nasal cannula oxygen  Post-op Assessment: Report given to PACU RN, Post -op Vital signs reviewed and stable, Patient moving all extremities and Patient moving all extremities X 4  Post vital signs: Reviewed and stable  Complications: No apparent anesthesia complications

## 2011-03-14 NOTE — ED Notes (Signed)
Pt is currently in xray dept.

## 2011-03-14 NOTE — ED Notes (Signed)
Pt has swelling to her right hand x 2 days. Blister noted on her right middle finger. Pt seen in ED yesterday and given IV antibiotics but is much worse today.

## 2011-03-14 NOTE — Preoperative (Signed)
Beta Blockers   Reason not to administer Beta Blockers:Not Applicable 

## 2011-03-14 NOTE — OR Nursing (Signed)
Culture results:  Many wbc's, abundant ply colysmono, gram positive cocci in pairs and clusters

## 2011-03-14 NOTE — Op Note (Signed)
Dictation [id number]

## 2011-03-14 NOTE — Brief Op Note (Signed)
03/14/2011  10:15 PM  PATIENT:  Tanya Ellis  71 y.o. female  PRE-OPERATIVE DIAGNOSIS:  infected right long finger  POST-OPERATIVE DIAGNOSIS:  infected right long finger  PROCEDURE:  Procedure(s): IRRIGATION AND DEBRIDEMENT EXTREMITY  SURGEON:  Surgeon(s): Tami Ribas, MD  PHYSICIAN ASSISTANT:   ASSISTANTS: none   ANESTHESIA:   general  EBL:  Total I/O In: 1000 [I.V.:1000] Out: -   BLOOD ADMINISTERED:none  DRAINS: packing in wounds, feeding tube in flexor sheath  LOCAL MEDICATIONS USED:  MARCAINE 10 CC  SPECIMEN:  Source of Specimen:  right long finger and right long finger flexor sheath  DISPOSITION OF SPECIMEN:  micro  COUNTS:  YES  TOURNIQUET:  * No tourniquets in log *  DICTATION: .Other Dictation: Dictation Number [id number]  PLAN OF CARE: Admit to inpatient   PATIENT DISPOSITION:  PACU - hemodynamically stable.   Delay start of Pharmacological VTE agent (>24hrs) due to surgical blood loss or risk of bleeding:  no

## 2011-03-14 NOTE — Anesthesia Procedure Notes (Signed)
Procedure Name: Intubation Date/Time: 03/14/2011 9:04 PM Performed by: Wray Kearns A Pre-anesthesia Checklist: Patient identified, Timeout performed, Suction available, Patient being monitored and Emergency Drugs available Patient Re-evaluated:Patient Re-evaluated prior to inductionOxygen Delivery Method: Circle System Utilized Preoxygenation: Pre-oxygenation with 100% oxygen Intubation Type: IV induction, Rapid sequence and Cricoid Pressure applied Laryngoscope Size: Mac and 3 Grade View: Grade I Tube size: 7.0 mm Number of attempts: 1 Airway Equipment and Method: stylet Placement Confirmation: ETT inserted through vocal cords under direct vision,  positive ETCO2 and breath sounds checked- equal and bilateral Secured at: 22 cm Tube secured with: Tape Dental Injury: Teeth and Oropharynx as per pre-operative assessment

## 2011-03-14 NOTE — ED Notes (Signed)
[facility] unavailable to transport pt still at this time. [facility] to transport pt. Medical Necessity form for EMS transport completed.

## 2011-03-14 NOTE — H&P (Signed)
DATE OF ADMISSION:  03/14/2011  PCP:   Milana Obey, MD, MD   Chief Complaint: Worsening Infection in Finger   HPI: Tanya Ellis is an 71 y.o. female who was seen in the ED at [facility] 1 day ago with complaints of redness pain and swelling that started as a blister and continued to progress.  The symptoms began 1 day ago, she denies any injury, trauma, insect bite or pet bite to the area.  She was evaluated at [facility] and given a dose of IV antibiotic and a prescription to continue as an outpatient on oral Doxycycline.  She reports that her hand continued to worsen so she went back to the ED and was evaluated and  Started on IV antibiotic therapy of Vancomycin and Zosyn and the EDP contacted the Hand Surgeon On call (Dr. Mina Marble) and transferred her to [facility] for surgery this evening.       Past Medical History  Diagnosis Date  . [facility] spotted fever   . Lyme disease   . Polio   . Measles   . Mumps   . GERD (gastroesophageal reflux disease)   . Sliding hiatal hernia   . Headache   . Uveitis   . Blind right eye   . Colon polyp     Past Surgical History  Procedure Date  . Back surgery   . Tonsillectomy   . Adenoidectomy     Medications:  HOME MEDS: Prior to Admission medications   Medication Sig Start Date End Date Taking? Authorizing Provider  doxycycline (VIBRAMYCIN) 100 MG capsule Take 1 capsule (100 mg total) by mouth 2 (two) times daily. 03/13/11 03/23/11 Yes Shelda Jakes, MD  HYDROcodone-acetaminophen (NORCO) 5-325 MG per tablet Take 1 tablet by mouth every 6 (six) hours as needed for pain. 03/13/11 03/23/11 Yes Shelda Jakes, MD  omeprazole (PRILOSEC) 40 MG capsule Take 40 mg by mouth at bedtime.   Yes Historical Provider, MD  ondansetron (ZOFRAN ODT) 4 MG disintegrating tablet Take 1 tablet (4 mg total) by mouth every 8 (eight) hours as needed for nausea. 03/13/11 03/20/11 Yes Shelda Jakes, MD    Allergies:  Allergies    Allergen Reactions  . Sulfa Antibiotics     Social History: widow, and vegetarian.    reports that she has never smoked. She does not have any smokeless tobacco history on file. She reports that she does not drink alcohol or use illicit drugs.  Family History: Family History  Problem Relation Age of Onset  . Coronary artery disease Father     Review of Systems:  The patient denies anorexia, weight loss, vision loss, decreased hearing, hoarseness, chest pain, syncope, dyspnea on exertion, peripheral edema, balance deficits, hemoptysis, abdominal pain, melena, hematochezia, severe indigestion/heartburn, hematuria, incontinence, genital sores, muscle weakness, suspicious skin lesions, transient blindness, difficulty walking, depression, unusual weight change, abnormal bleeding, enlarged lymph nodes, angioedema, and breast masses.   Physical Exam:  GEN:  Pleasant 71 year old obese Caucasian female examined  and in no acute distress; cooperative with exam Filed Vitals:   03/14/11 1523 03/14/11 1639 03/14/11 1645 03/14/11 1829  BP: 131/57 116/59  115/69  Pulse: 88 83  93  Temp: 99.7 F (37.6 Ellis) 98.3 F (36.8 Ellis)  101.3 F (38.5 Ellis)  TempSrc: Oral   Oral  Resp: 16 16  18   Height:    5\' 4"  (1.626 m)  Weight:    61.689 kg (136 lb)  SpO2:  94%  94% 94%   Blood pressure 115/69, pulse 93, temperature 101.3 F (38.5 Ellis), temperature source Oral, resp. rate 18, height 5\' 4"  (1.626 m), weight 61.689 kg (136 lb), SpO2 94.00%. PSYCH: She is alert and oriented x4; does not appear anxious does not appear depressed; affect is normal HEENT: Normocephalic and Atraumatic, Mucous membranes pink; PERRLA; EOM intact; Fundi:  Benign;  No scleral icterus, Nares: Patent, Oropharynx: Clear, Edentulous on Upper palate, Poor Dentition on lower, Neck:  FROM, no cervical lymphadenopathy nor thyromegaly or carotid bruit; no JVD; Breasts:: Not examined CHEST WALL: No tenderness CHEST: Normal respiration, clear to  auscultation bilaterally HEART: Regular rate and rhythm; no murmurs rubs or gallops BACK: No kyphosis or scoliosis; no CVA tenderness ABDOMEN: Positive Bowel Sounds, Obese, soft non-tender; no masses, no organomegaly, no pannus; no intertriginous candida. Rectal Exam: Not done EXTREMITIES: No cyanosis, clubbing or edema except 3+ edema and inflammation around the DIP extending beyond the PIP joint of the Right 3rd finger, no visible ulcerations. Genitalia: not examined PULSES: 2+ and symmetric SKIN: Normal hydration no rash  CNS: Cranial nerves 2-12 grossly intact no focal neurologic deficit   Labs & Imaging Results for orders placed during the hospital encounter of 03/14/11 (from the past 48 hour(s))  CBC     Status: Abnormal   Collection Time   03/14/11  1:05 PM      Component Value Range Comment   WBC 13.1 (*) 4.0 - 10.5 (K/uL)    RBC 3.70 (*) 3.87 - 5.11 (MIL/uL)    Hemoglobin 11.4 (*) 12.0 - 15.0 (g/dL)    HCT 16.1 (*) 09.6 - 46.0 (%)    MCV 89.5  78.0 - 100.0 (fL)    MCH 30.8  26.0 - 34.0 (pg)    MCHC 34.4  30.0 - 36.0 (g/dL)    RDW 04.5  40.9 - 81.1 (%)    Platelets 114 (*) 150 - 400 (K/uL)   DIFFERENTIAL     Status: Abnormal   Collection Time   03/14/11  1:05 PM      Component Value Range Comment   Neutrophils Relative 79 (*) 43 - 77 (%)    Neutro Abs 10.3 (*) 1.7 - 7.7 (K/uL)    Lymphocytes Relative 13  12 - 46 (%)    Lymphs Abs 1.6  0.7 - 4.0 (K/uL)    Monocytes Relative 9  3 - 12 (%)    Monocytes Absolute 1.1 (*) 0.1 - 1.0 (K/uL)    Eosinophils Relative 0  0 - 5 (%)    Eosinophils Absolute 0.0  0.0 - 0.7 (K/uL)    Basophils Relative 0  0 - 1 (%)    Basophils Absolute 0.0  0.0 - 0.1 (K/uL)   COMPREHENSIVE METABOLIC PANEL     Status: Abnormal   Collection Time   03/14/11  1:05 PM      Component Value Range Comment   Sodium 131 (*) 135 - 145 (mEq/L)    Potassium 3.7  3.5 - 5.1 (mEq/L)    Chloride 96  96 - 112 (mEq/L)    CO2 26  19 - 32 (mEq/L)    Glucose, Bld 131  (*) 70 - 99 (mg/dL)    BUN 10  6 - 23 (mg/dL)    Creatinine, Ser 9.14  0.50 - 1.10 (mg/dL)    Calcium 9.4  8.4 - 10.5 (mg/dL)    Total Protein 6.7  6.0 - 8.3 (g/dL)    Albumin 3.8  3.5 -  5.2 (g/dL)    AST 18  0 - 37 (U/L)    ALT 14  0 - 35 (U/L)    Alkaline Phosphatase 77  39 - 117 (U/L)    Total Bilirubin 0.8  0.3 - 1.2 (mg/dL)    GFR calc non Af Amer 83 (*) >90 (mL/min)    GFR calc Af Amer >90  >90 (mL/min)    Dg Chest 2 View  03/14/2011  *RADIOLOGY REPORT*  Clinical Data: Preop  CHEST - 2 VIEW  Comparison: 02/09/2011  Findings: Lungs are clear. No pleural effusion or pneumothorax.  Cardiomediastinal silhouette is within normal limits.  Mild degenerative changes of the visualized thoracolumbar spine.  IMPRESSION: No evidence of acute cardiopulmonary disease.  Original Report Authenticated By: Charline Bills, M.D.   Dg Hand Complete Right  03/13/2011  *RADIOLOGY REPORT*  Clinical Data: Pain, erythema, swelling and blackened nail bed for 1 day.  No acute injury.  RIGHT HAND - COMPLETE 3+ VIEW  Comparison: None.  Findings: There is mild diffuse osteopenia.  Scattered mild interphalangeal and intercarpal degenerative changes are present. 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  No acute fracture, dislocation or bone destruction is evident.  The soft tissues are diffusely prominent without apparent focal swelling.  IMPRESSION: No acute osseous findings, focal soft tissue swelling or bone destruction identified.  Original Report Authenticated By: Gerrianne Scale, M.D.   Dg Finger Middle Right  03/14/2011  *RADIOLOGY REPORT*  Clinical Data: Right middle finger black/swollen  RIGHT MIDDLE FINGER 2+V  Comparison: 03/13/2011  Findings: No fracture or dislocation is seen.  The joint spaces are preserved. Cortical irregularity/erosions involving the 3rd proximal/middle phalanx along the ulnar aspect of the third PIP joint.  Moderate soft tissue swelling along the dorsal/medial  aspect of the third middle phalanx, new/increased.  IMPRESSION: No fracture or dislocation is seen.  Cortical irregularity/erosions involving the 3rd proximal/middle phalanx along the ulnar aspect of the third PIP joint.  Moderate soft tissue swelling, new/increased.  This appearance is worrisome for infection or less likely an inflammatory arthropathy.  Original Report Authenticated By: Charline Bills, M.D.      Assessment/Plan:  1. Cellultis Right 3rd Finger 2.  Fever Due to #1 3.  Hyponatremia 4.  Obesity   Plan:  Patient has been admitted and will have Surgery to Drain the Right 3rd Finger and prevent compartment syndrome.  IV Antibiotic therapy will continue with Vancomycin and Zosyn. Pain control therapy has been ordered PRN.  And, IV Fluids have also been ordered which should help correct her sodium level.  Her electrolytes will be monitored and adjusted as needed.  SCDs have been ordered for DVT prophylaxis.  And Patient is currently NPO, and awaiting surgery. Dr. Merlyn Lot is seeing her now.  Other plans as per orders.    CODE STATUS:      FULL CODE        Tanya Ellis 03/14/2011, 8:04 PM

## 2011-03-14 NOTE — Consult Note (Signed)
Tanya Ellis is an 71 y.o. female.   Chief Complaint: right long finger infection HPI: 71 yo rhd female states she injured right long finger 2 days ago but doesn't remember how.  Seen at [facility] ED yesterday and given IV abx and sent home on oral abx.  Has had increased pain and swelling in past day with chills.  No wounds remembered.  No significant previous injury.  States she has carpal tunnel syndrome that makes sensation in the fingers poor.  Past Medical History  Diagnosis Date  . [facility] spotted fever   . Lyme disease   . Polio   . Measles   . Mumps   . GERD (gastroesophageal reflux disease)   . Sliding hiatal hernia   . Headache   . Uveitis   . Blind right eye   . Colon polyp     Past Surgical History  Procedure Date  . Back surgery   . Tonsillectomy   . Adenoidectomy     History reviewed. No pertinent family history. Social History:  reports that she has never smoked. She does not have any smokeless tobacco history on file. She reports that she does not drink alcohol or use illicit drugs.  Allergies:  Allergies  Allergen Reactions  . Sulfa Antibiotics     Medications Prior to Admission  Medication Dose Route Frequency Provider Last Rate Last Dose  . 0.9 %  sodium chloride infusion   Intravenous Continuous Ward Givens, MD      . diphenhydrAMINE (BENADRYL) injection 25 mg  25 mg Intravenous Once Shelda Jakes, MD   25 mg at 03/13/11 1552  . morphine 4 MG/ML injection 4 mg  4 mg Intravenous Once Ward Givens, MD   4 mg at 03/14/11 1331  . ondansetron (ZOFRAN) injection 4 mg  4 mg Intravenous Once Shelda Jakes, MD   4 mg at 03/13/11 1419  . ondansetron (ZOFRAN) injection 4 mg  4 mg Intravenous Once Ward Givens, MD   4 mg at 03/14/11 1330  . ondansetron (ZOFRAN) injection 4 mg  4 mg Intravenous Q6H PRN Ward Givens, MD      . piperacillin-tazobactam (ZOSYN) IVPB 3.375 g  3.375 g Intravenous Once Ward Givens, MD   3.375 g at 03/14/11 1334  . sodium chloride  0.9 % bolus 1,000 mL  1,000 mL Intravenous Once Ward Givens, MD   1,000 mL at 03/14/11 1329  . sodium chloride 0.9 % bolus 500 mL  500 mL Intravenous Once Shelda Jakes, MD   500 mL at 03/13/11 1417  . vancomycin (VANCOCIN) IVPB 1000 mg/200 mL premix  1,000 mg Intravenous Once Shelda Jakes, MD   1,000 mg at 03/13/11 1423  . vancomycin (VANCOCIN) IVPB 1000 mg/200 mL premix  1,000 mg Intravenous Once Ward Givens, MD   1,000 mg at 03/14/11 1453  . DISCONTD: 0.9 %  sodium chloride infusion   Intravenous Continuous Shelda Jakes, MD      . DISCONTD: 0.9 %  sodium chloride infusion   Intravenous Continuous Ward Givens, MD      . DISCONTD: morphine 4 MG/ML injection 1 mg  1 mg Intravenous Q1H PRN Ward Givens, MD      . DISCONTD: morphine 4 MG/ML injection 4 mg  4 mg Intravenous Q1H PRN Ward Givens, MD      . DISCONTD: morphine 4 MG/ML injection 4 mg  4 mg Intravenous Q1H PRN  Ward Givens, MD   4 mg at 03/14/11 1622  . DISCONTD: ondansetron (ZOFRAN) injection 4 mg  4 mg Intravenous Q2H PRN Ward Givens, MD       Medications Prior to Admission  Medication Sig Dispense Refill  . doxycycline (VIBRAMYCIN) 100 MG capsule Take 1 capsule (100 mg total) by mouth 2 (two) times daily.  14 capsule  0  . HYDROcodone-acetaminophen (NORCO) 5-325 MG per tablet Take 1 tablet by mouth every 6 (six) hours as needed for pain.  10 tablet  0  . omeprazole (PRILOSEC) 40 MG capsule Take 40 mg by mouth at bedtime.      . ondansetron (ZOFRAN ODT) 4 MG disintegrating tablet Take 1 tablet (4 mg total) by mouth every 8 (eight) hours as needed for nausea.  10 tablet  0    Results for orders placed during the hospital encounter of 03/14/11 (from the past 48 hour(s))  CBC     Status: Abnormal   Collection Time   03/14/11  1:05 PM      Component Value Range Comment   WBC 13.1 (*) 4.0 - 10.5 (K/uL)    RBC 3.70 (*) 3.87 - 5.11 (MIL/uL)    Hemoglobin 11.4 (*) 12.0 - 15.0 (g/dL)    HCT 62.1 (*) 30.8 - 46.0 (%)    MCV  89.5  78.0 - 100.0 (fL)    MCH 30.8  26.0 - 34.0 (pg)    MCHC 34.4  30.0 - 36.0 (g/dL)    RDW 65.7  84.6 - 96.2 (%)    Platelets 114 (*) 150 - 400 (K/uL)   DIFFERENTIAL     Status: Abnormal   Collection Time   03/14/11  1:05 PM      Component Value Range Comment   Neutrophils Relative 79 (*) 43 - 77 (%)    Neutro Abs 10.3 (*) 1.7 - 7.7 (K/uL)    Lymphocytes Relative 13  12 - 46 (%)    Lymphs Abs 1.6  0.7 - 4.0 (K/uL)    Monocytes Relative 9  3 - 12 (%)    Monocytes Absolute 1.1 (*) 0.1 - 1.0 (K/uL)    Eosinophils Relative 0  0 - 5 (%)    Eosinophils Absolute 0.0  0.0 - 0.7 (K/uL)    Basophils Relative 0  0 - 1 (%)    Basophils Absolute 0.0  0.0 - 0.1 (K/uL)   COMPREHENSIVE METABOLIC PANEL     Status: Abnormal   Collection Time   03/14/11  1:05 PM      Component Value Range Comment   Sodium 131 (*) 135 - 145 (mEq/L)    Potassium 3.7  3.5 - 5.1 (mEq/L)    Chloride 96  96 - 112 (mEq/L)    CO2 26  19 - 32 (mEq/L)    Glucose, Bld 131 (*) 70 - 99 (mg/dL)    BUN 10  6 - 23 (mg/dL)    Creatinine, Ser 9.52  0.50 - 1.10 (mg/dL)    Calcium 9.4  8.4 - 10.5 (mg/dL)    Total Protein 6.7  6.0 - 8.3 (g/dL)    Albumin 3.8  3.5 - 5.2 (g/dL)    AST 18  0 - 37 (U/L)    ALT 14  0 - 35 (U/L)    Alkaline Phosphatase 77  39 - 117 (U/L)    Total Bilirubin 0.8  0.3 - 1.2 (mg/dL)    GFR calc non Af Amer 83 (*) >90 (mL/min)  GFR calc Af Amer >90  >90 (mL/min)     Dg Chest 2 View  03/14/2011  *RADIOLOGY REPORT*  Clinical Data: Preop  CHEST - 2 VIEW  Comparison: 02/09/2011  Findings: Lungs are clear. No pleural effusion or pneumothorax.  Cardiomediastinal silhouette is within normal limits.  Mild degenerative changes of the visualized thoracolumbar spine.  IMPRESSION: No evidence of acute cardiopulmonary disease.  Original Report Authenticated By: Charline Bills, M.D.   Dg Hand Complete Right  03/13/2011  *RADIOLOGY REPORT*  Clinical Data: Pain, erythema, swelling and blackened nail bed for 1 day.   No acute injury.  RIGHT HAND - COMPLETE 3+ VIEW  Comparison: None.  Findings: There is mild diffuse osteopenia.  Scattered mild interphalangeal and intercarpal degenerative changes are present. 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  No acute fracture, dislocation or bone destruction is evident.  The soft tissues are diffusely prominent without apparent focal swelling.  IMPRESSION: No acute osseous findings, focal soft tissue swelling or bone destruction identified.  Original Report Authenticated By: Gerrianne Scale, M.D.   Dg Finger Middle Right  03/14/2011  *RADIOLOGY REPORT*  Clinical Data: Right middle finger black/swollen  RIGHT MIDDLE FINGER 2+V  Comparison: 03/13/2011  Findings: No fracture or dislocation is seen.  The joint spaces are preserved. Cortical irregularity/erosions involving the 3rd proximal/middle phalanx along the ulnar aspect of the third PIP joint.  Moderate soft tissue swelling along the dorsal/medial aspect of the third middle phalanx, new/increased.  IMPRESSION: No fracture or dislocation is seen.  Cortical irregularity/erosions involving the 3rd proximal/middle phalanx along the ulnar aspect of the third PIP joint.  Moderate soft tissue swelling, new/increased.  This appearance is worrisome for infection or less likely an inflammatory arthropathy.  Original Report Authenticated By: Charline Bills, M.D.     A comprehensive review of systems was negative except for: Constitutional: positive for chills Gastrointestinal: positive for nausea and vomiting  Blood pressure 115/69, pulse 93, temperature 101.3 F (38.5 C), temperature source Oral, resp. rate 18, height 5\' 4"  (1.626 m), weight 61.689 kg (136 lb), SpO2 94.00%.  General appearance: alert, cooperative, appears stated age and mild distress Head: Normocephalic, without obvious abnormality, atraumatic Neck: supple, symmetrical, trachea midline Resp: clear to auscultation bilaterally Cardio:  regular rate and rhythm GI: soft, non-tender; bowel sounds normal; no masses,  no organomegaly Extremities: left hand intact sensation and capillary refill all digits.  +epl/fpl/io.  right hand intact sensation t/l/r/s.  poor sensation in index.  some decrease in long.  + capillary refill.  +epl/fpl/io.  hematoma dorsoulnar side of long finger. erythema in long finger and dorsum of hand.  long finger swollen and ttp especially dorsally.  subungual hematoma.  tip of finger with ecchymosis and possible necrosis. Pulses: 2+ and symmetric Skin: as above Neurologic: Grossly normal Incision/Wound: As above  Assessment/Plan Right long finger infection.  Recommend OR for I&D of right long finger and hand.  Discussed risks, benefits, alternatives including risks of blood loss, infection, damage to nerves, vessels, tendons, ligament, bone, failure of surgery, need for further surgery, repeat irrigation and debridement, or need for amputation.  She voiced understanding and agrees with the plan of care.  Constant Mandeville R 03/14/2011, 7:50 PM

## 2011-03-15 DIAGNOSIS — K219 Gastro-esophageal reflux disease without esophagitis: Secondary | ICD-10-CM | POA: Diagnosis present

## 2011-03-15 LAB — CBC
Hemoglobin: 9.7 g/dL — ABNORMAL LOW (ref 12.0–15.0)
MCH: 30.5 pg (ref 26.0–34.0)
MCV: 89.6 fL (ref 78.0–100.0)
Platelets: 87 10*3/uL — ABNORMAL LOW (ref 150–400)
RBC: 3.18 MIL/uL — ABNORMAL LOW (ref 3.87–5.11)
WBC: 10.9 10*3/uL — ABNORMAL HIGH (ref 4.0–10.5)

## 2011-03-15 LAB — BASIC METABOLIC PANEL
CO2: 25 mEq/L (ref 19–32)
Calcium: 8.5 mg/dL (ref 8.4–10.5)
GFR calc non Af Amer: 81 mL/min — ABNORMAL LOW (ref >90)
Glucose, Bld: 124 mg/dL — ABNORMAL HIGH (ref 70–99)
Potassium: 3.5 mEq/L (ref 3.5–5.1)
Sodium: 132 mEq/L — ABNORMAL LOW (ref 135–145)

## 2011-03-15 MED ORDER — SODIUM CHLORIDE 0.9 % IV SOLN: INTRAVENOUS | Status: DC

## 2011-03-15 MED ORDER — ONDANSETRON HCL 4 MG/2ML IJ SOLN: 4.0000 mg | INTRAMUSCULAR | Status: DC | PRN

## 2011-03-15 MED ORDER — PANTOPRAZOLE SODIUM 40 MG PO TBEC
80.0000 mg | DELAYED_RELEASE_TABLET | Freq: Every day | ORAL | Status: DC
  Administered 2011-03-15: 80 mg via ORAL
  Filled 2011-03-15: qty 2

## 2011-03-15 MED ORDER — WHITE PETROLATUM GEL
Status: AC
  Filled 2011-03-15: qty 5

## 2011-03-15 NOTE — Op Note (Signed)
NAMEANTASIA, HAIDER                  ACCOUNT NO.:  [id number]  MEDICAL RECORD NO.:  [id number]  LOCATION:  5020                         FACILITY:  [facility]  PHYSICIAN:  Betha Loa, MD        DATE OF BIRTH:  Jul 18, 1940  DATE OF PROCEDURE:  03/14/2011 DATE OF DISCHARGE:                              OPERATIVE REPORT   PREOPERATIVE DIAGNOSES:  Right long finger abscess and possible flexor tenosynovitis.  POSTOPERATIVE DIAGNOSES:  Right long finger abscess and flexor tenosynovitis.  PROCEDURES:  Irrigation and debridement of right long finger abscess and right long finger flexor tendon sheath.  SURGEON:  Betha Loa, MD  ASSISTANTS:  None.  ANESTHESIA:  General.  INTRAVENOUS FLUIDS:  Per anesthesia flow sheet.  ESTIMATED BLOOD LOSS:  Minimal.  COMPLICATIONS:  None.  SPECIMENS:  Aerobic and anaerobic cultures from both dorsum of the finger and flexor tendon sheath.  TOURNIQUET TIME:  53 minutes.  DISPOSITION:  Stable to PACU.  INDICATIONS:  Ms. Parrillo is a 71 year old right-hand-dominant female, who states that 2 days ago she thinks she injured the tip of her right long finger.  She is not sure how, but she may have burned it.  She went to [facility] yesterday where she was given a dose of IV antibiotics and started on oral antibiotics.  She had worsening of her symptoms overnight.  She has had some chills.  She has also had some nausea.  She presented back to [facility] today and was transferred to [facility] for further evaluation.  On evaluation, she had intact sensation and capillary refill in the fingertips with the exception of the index finger and long finger, which were slightly decreased.  She had a large hematoma-type bulla on the dorsal ulnar side of the right long finger.  She is tender to palpation of the right long finger and over the dorsum of the hand.  She had swelling in the finger and erythema.  There was a streak  going up the dorsum of the arm.  I discussed with Ms. Fesler the nature of her condition.  I recommended going to the operating room for irrigation and debridement of the right long finger and possibly the flexor tendon sheath.  Risks, benefits, and alternatives of surgery were discussed including the risk of blood loss, infection, damage to nerves, vessels, tendons, ligaments, bone, failure of surgery, need for additional surgery, complications with wound healing, continued pain, continued infection, need for repeat irrigation and debridement, possible amputation.  She voiced understanding of the risks and elected to proceed.  OPERATIVE COURSE:  After being identified preoperatively by myself, the patient and I agreed upon the procedure and site of procedure.  The surgical site was marked.  The risks, benefits, and alternatives of surgery were reviewed and she wished to proceed.  Surgical consent had been signed.  She has been given antibiotics already for coverage of her infection.  She was transferred to the operating room, placed on the operating room table in a supine position with the right upper extremity on arm board.  General anesthesia was induced by the anesthesiologist. Right upper extremity was prepped  and draped in normal sterile orthopedic fashion.  A surgical pause was performed between the surgeons, Anesthesia, and operating room staff, and all were in agreement with the patient, procedure, and site of the procedure.  The tourniquet at the proximal aspect of the extremity was inflated to 250 mmHg after gravity exsanguination of the limb.  The bulla on the dorsum of the finger was incised.  There was thin, turbid fluid within it. This was cultured for aerobic and anaerobic cultures.  The epidermis had separated from the underlying dermis.  This was all peeled off.  The nail came off as well.  There were no apparent wounds.  Some of the deeper dermis was very damaged and  easily scraped off using a Therapist, nutritional lightly.  This was all debrided.  The finger was copiously irrigated with a 1000 mL of sterile saline by Cysto tubing.  An incision was made on the dorsum of the finger at the middle phalanx.  There was no purulence within the deeper tissues.  Incision was made, centered over the MP joint of the long finger and carried on to the dorsum of the hand and into the proximal aspect of the proximal phalanx.  Again, there was no purulence within the deep tissues, though there was some edematous fluid and the tissues separated easily.  These wounds were again copiously irrigated with 2000 mL of sterile saline by Cysto Tubing.  Attention was turned to the volar aspect of the finger. Incision was made over the distal phalanx.  This was carried into the subcutaneous tissues by spreading technique.  The flexor tendon sheath was entered.  The finger was milked and there was purulence within the sheath.  This was cultured for aerobic and anaerobic cultures.  An incision was made at the palm of the hand.  The A1 pulley was incised. A #5 pediatric feeding tube was placed into the flexor tendon sheath. 300 mL of sterile saline were irrigated through the flexor tendon sheath using a syringe.  Good effluent was obtained from both the proximal and distal wounds.  All wounds were then copiously irrigated with 3000 mL of sterile saline by Cysto tubing.  The wounds were all packed with quarter- inch Iodoform gauze.  A single suture was placed on the larger wound on the dorsum of the hand to prevent the skin edges from retracting.  The skin edges were not approximated.  All wounds were packed with quarter- inch Iodoform gauze, including the dorsum of the hand.  The wounds were dressed with sterile Xeroform and 4 x 4s and wrapped with a Kerlix.  A volar splint was placed including the long, ring, and small fingers of the hand in a resting position.  This was wrapped with  Kerlix and Ace bandage.  The tourniquet was deflated at approximately 53 minutes. Fingertips were pink with brisk capillary refill after deflation of the tourniquet.  Bipolar electrocautery had been used throughout the case for hemostasis.  The wounds were injected with 10 mL of 0.25% plain Marcaine to aid in postoperative analgesia prior to placing the dressing.  The operative drapes were broken down.  The patient was awoken from anesthesia safely.  She was transferred back to the stretcher and taken to PACU in stable condition.  She will be kept for IV antibiotics.  We will monitor her progress.  She is admitted to the hospitalist.     Betha Loa, MD     KK/MEDQ  D:  03/14/2011  T:  03/15/2011  Job:  [id number]

## 2011-03-15 NOTE — Progress Notes (Signed)
Utilization Review Completed.Tanya Ellis T1/21/2013   

## 2011-03-15 NOTE — Progress Notes (Signed)
Subjective: Feels much better this AM.  Objective: Vital signs in last 24 hours: Temp:  [97.9 F (36.6 C)-102 F (38.9 C)] 98.4 F (36.9 C) (01/21 0532) Pulse Rate:  [75-93] 75  (01/21 0532) Resp:  [10-20] 18  (01/21 0532) BP: (99-131)/(53-77) 110/66 mmHg (01/21 0532) SpO2:  [94 %-99 %] 97 % (01/21 0532) Weight:  [61.689 kg (136 lb)] 61.689 kg (136 lb) (01/20 1829) Weight change:  Last BM Date: 03/13/11  Intake/Output from previous day: 01/20 0701 - 01/21 0700 In: 1200 [I.V.:1200] Out: -      Physical Exam: General: Comfortable, alert, communicative, fully oriented, not short of breath at rest.  HEENT:  Mild clinical pallor, no jaundice, no conjunctival injection or discharge. NECK:  Supple, JVP not seen, no carotid bruits, no palpable lymphadenopathy, no palpable goiter. CHEST:  Clinically clear to auscultation, no wheezes, no crackles. HEART:  Sounds 1 and 2 heard, normal, regular, no murmurs. ABDOMEN:  Full, soft, non-tender, no palpable organomegaly, no palpable masses, normal bowel sounds. GENITALIA:  Not examined. UPPER EXTREMITIES:  RUE bandaged from upper third of forearm, to tips of fingers. LOWER EXTREMITIES:  No pitting edema, palpable peripheral pulses. MUSCULOSKELETAL SYSTEM:  Generalized osteoarthritic changes, otherwise, normal. CENTRAL NERVOUS SYSTEM:  No focal neurologic deficit on gross examination.  Lab Results:  [facility] 03/15/11 0648 03/14/11 1305  WBC 10.9* 13.1*  HGB 9.7* 11.4*  HCT 28.5* 33.1*  PLT 87* 114*    Basename 03/14/11 1305 03/13/11 1424  NA 131* 132*  K 3.7 3.6  CL 96 99  CO2 26 25  GLUCOSE 131* 125*  BUN 10 9  CREATININE 0.73 0.68  CALCIUM 9.4 9.4   Recent Results (from the past 240 hour(s))  CULTURE, ROUTINE-ABSCESS     Status: Normal (Preliminary result)   Collection Time   03/14/11  9:20 PM      Component Value Range Status Comment   Specimen Description ABSCESS RIGHT FINGER   Final    Special Requests NONE   Final    Gram Stain     Final    Value: ABUNDANT WBC PRESENT,BOTH PMN AND MONONUCLEAR     GRAM POSITIVE COCCI IN PAIRS     IN CLUSTERS Performed at [facility]   Culture NO GROWTH   Final    Report Status PENDING   Incomplete   ANAEROBIC CULTURE     Status: Normal (Preliminary result)   Collection Time   03/14/11  9:20 PM      Component Value Range Status Comment   Specimen Description ABSCESS RIGHT FINGER   Final    Special Requests NONE   Final    Gram Stain     Final    Value: ABUNDANT WBC PRESENT,BOTH PMN AND MONONUCLEAR     GRAM POSITIVE COCCI IN PAIRS     IN CLUSTERS Performed at [facility]   Culture PENDING   Incomplete    Report Status PENDING   Incomplete   GRAM STAIN     Status: Normal   Collection Time   03/14/11  9:20 PM      Component Value Range Status Comment   Specimen Description ABSCESS RIGHT FINGER   Final    Special Requests NONE   Final    Gram Stain     Final    Value: ABUNDANT WBC PRESENT,BOTH PMN AND MONONUCLEAR     MODERATE GRAM POSITIVE COCCI IN PAIRS IN CLUSTERS   Report Status 03/14/2011 FINAL   Final  CULTURE, ROUTINE-ABSCESS     Status: Normal (Preliminary result)   Collection Time   03/14/11  9:59 PM      Component Value Range Status Comment   Specimen Description ABSCESS   Final    Special Requests RIGHT LONG FINGER   Final    Gram Stain     Final    Value: RARE WBC PRESENT,BOTH PMN AND MONONUCLEAR     RARE GRAM POSITIVE COCCI IN PAIRS     Performed at [facility]   Culture NO GROWTH   Final    Report Status PENDING   Incomplete   ANAEROBIC CULTURE     Status: Normal (Preliminary result)   Collection Time   03/14/11  9:59 PM      Component Value Range Status Comment   Specimen Description ABSCESS   Final    Special Requests RIGHT LONG FINGER   Final    Gram Stain     Final    Value: RARE WBC PRESENT,BOTH PMN AND MONONUCLEAR     RARE GRAM POSITIVE COCCI IN PAIRS     Performed at [facility]   Culture PENDING    Incomplete    Report Status PENDING   Incomplete   GRAM STAIN     Status: Normal   Collection Time   03/14/11  9:59 PM      Component Value Range Status Comment   Specimen Description ABSCESS   Final    Special Requests RIGHT LONG FINGER   Final    Gram Stain     Final    Value: RARE WBC PRESENT,BOTH PMN AND MONONUCLEAR     RARE GRAM POSITIVE COCCI IN PAIRS   Report Status 03/14/2011 FINAL   Final      Studies/Results: Dg Chest 2 View  03/14/2011  *RADIOLOGY REPORT*  Clinical Data: Preop  CHEST - 2 VIEW  Comparison: 02/09/2011  Findings: Lungs are clear. No pleural effusion or pneumothorax.  Cardiomediastinal silhouette is within normal limits.  Mild degenerative changes of the visualized thoracolumbar spine.  IMPRESSION: No evidence of acute cardiopulmonary disease.  Original Report Authenticated By: Charline Bills, M.D.   Dg Hand Complete Right  03/13/2011  *RADIOLOGY REPORT*  Clinical Data: Pain, erythema, swelling and blackened nail bed for 1 day.  No acute injury.  RIGHT HAND - COMPLETE 3+ VIEW  Comparison: None.  Findings: There is mild diffuse osteopenia.  Scattered mild interphalangeal and intercarpal degenerative changes are present. 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  No acute fracture, dislocation or bone destruction is evident.  The soft tissues are diffusely prominent without apparent focal swelling.  IMPRESSION: No acute osseous findings, focal soft tissue swelling or bone destruction identified.  Original Report Authenticated By: Gerrianne Scale, M.D.   Dg Finger Middle Right  03/14/2011  *RADIOLOGY REPORT*  Clinical Data: Right middle finger black/swollen  RIGHT MIDDLE FINGER 2+V  Comparison: 03/13/2011  Findings: No fracture or dislocation is seen.  The joint spaces are preserved. Cortical irregularity/erosions involving the 3rd proximal/middle phalanx along the ulnar aspect of the third PIP joint.  Moderate soft tissue swelling along the  dorsal/medial aspect of the third middle phalanx, new/increased.  IMPRESSION: No fracture or dislocation is seen.  Cortical irregularity/erosions involving the 3rd proximal/middle phalanx along the ulnar aspect of the third PIP joint.  Moderate soft tissue swelling, new/increased.  This appearance is worrisome for infection or less likely an inflammatory arthropathy.  Original Report Authenticated By: Charline Bills, M.D.  Medications: Scheduled Meds:    .  morphine injection  4 mg Intravenous Once  . ondansetron (ZOFRAN) IV  4 mg Intravenous Once  . pantoprazole  80 mg Oral Q1200  . piperacillin-tazobactam (ZOSYN)  IV  3.375 g Intravenous Once  . piperacillin-tazobactam (ZOSYN)  IV  3.375 g Intravenous Q8H  . piperacillin-tazobactam (ZOSYN)  IV  3.375 g Intravenous To OR  . sodium chloride  1,000 mL Intravenous Once  . vancomycin  1,000 mg Intravenous Once  . vancomycin  1,000 mg Intravenous Q24H   Continuous Infusions:    . sodium chloride    . DISCONTD: sodium chloride    . DISCONTD: sodium chloride    . DISCONTD: sodium chloride     PRN Meds:.acetaminophen, acetaminophen, HYDROmorphone, HYDROmorphone, meperidine, morphine, ondansetron (ZOFRAN) IV, ondansetron (ZOFRAN) IV, ondansetron, DISCONTD: bupivacaine, DISCONTD: morphine, DISCONTD:  morphine injection, DISCONTD:  morphine injection, DISCONTD: ondansetron, DISCONTD: ondansetron, DISCONTD: ondansetron (ZOFRAN) IV, DISCONTD: sodium chloride irrigation  Assessment/Plan:  Principal Problem:  *Tendinitis of finger: Patient has infection/tendinitis right 3rd finger, and is s/p irrigation and debridement by Dr Betha Loa, hand surgeon, in early AM. Clinically, she appears pain-free, and stable. On Vancomycin/Zosyn, day#2. Manage as recommended. Active Problems:  1.GERD (gastroesophageal reflux disease): Asymptomatic, and not problematic.  Disposition: Per hand surgeon. Iv fluids have been discontinued today, as hydration is  satisfactory, and patient is taking POs well.   LOS: 1 day   Collette Pescador,CHRISTOPHER 03/15/2011, 8:22 AM

## 2011-03-15 NOTE — Progress Notes (Signed)
Subjective: 1 Day Post-Op Procedure(s) (LRB): IRRIGATION AND DEBRIDEMENT EXTREMITY (Right) Patient reports pain as mild.  Feels much better.  No fevers, chills, night sweats.  Objective: Vital signs in last 24 hours: Temp:  [97.9 F (36.6 C)-102 F (38.9 C)] 98 F (36.7 C) (01/21 0800) Pulse Rate:  [75-93] 76  (01/21 0800) Resp:  [10-20] 14  (01/21 0800) BP: (99-131)/(53-77) 101/61 mmHg (01/21 0800) SpO2:  [93 %-99 %] 93 % (01/21 0800) Weight:  [61.689 kg (136 lb)] 61.689 kg (136 lb) (01/20 1829)  Intake/Output from previous day: 01/20 0701 - 01/21 0700 In: 1200 [I.V.:1200] Out: -  Intake/Output this shift:     Basename 03/15/11 0648 03/14/11 1305 03/13/11 1424  HGB 9.7* 11.4* 11.6*    Basename 03/15/11 0648 03/14/11 1305  WBC 10.9* 13.1*  RBC 3.18* 3.70*  HCT 28.5* 33.1*  PLT 87* 114*    Basename 03/15/11 0648 03/14/11 1305  NA 132* 131*  K 3.5 3.7  CL 99 96  CO2 25 26  BUN 11 10  CREATININE 0.79 0.73  GLUCOSE 124* 131*  CALCIUM 8.5 9.4   No results found for this basename: LABPT:2,INR:2 in the last 72 hours  Looks more comfortable and alert.  sensation intact, brisk capillary refill.  erythema decreased.  streak in arm resolving.  much less tender to palpation.  Assessment/Plan: 1 Day Post-Op Procedure(s) (LRB): IRRIGATION AND DEBRIDEMENT EXTREMITY (Right) Improving.  Pain much decreased.  Will plan hydrotherapy with packing change tomorrow.  If tolerated well and continues to be afebrile with decreasing wbc, can go home tomorrow.  Continue abx.  Cx's: gram + cocci.  Pending.  Shantea Poulton R 03/15/2011, 10:59 AM

## 2011-03-15 NOTE — Progress Notes (Signed)
03/15/11 11:23 Hydrotherapy Note: Noted new hydrotherapy with Dr. Merrilee Seashore note stating to begin tomorrow 03/16/11.  RN aware.  Will follow-up then.  03/15/2011 Cephus Shelling, PT, DPT [phone]

## 2011-03-16 ENCOUNTER — Encounter (HOSPITAL_COMMUNITY): Payer: Self-pay | Admitting: Orthopedic Surgery

## 2011-03-16 LAB — BASIC METABOLIC PANEL
BUN: 11 mg/dL (ref 6–23)
Calcium: 8.3 mg/dL — ABNORMAL LOW (ref 8.4–10.5)
GFR calc non Af Amer: 85 mL/min — ABNORMAL LOW (ref >90)
Glucose, Bld: 138 mg/dL — ABNORMAL HIGH (ref 70–99)

## 2011-03-16 LAB — CBC
HCT: 27.6 % — ABNORMAL LOW (ref 36.0–46.0)
Hemoglobin: 9.3 g/dL — ABNORMAL LOW (ref 12.0–15.0)
MCH: 29.9 pg (ref 26.0–34.0)
MCHC: 33.7 g/dL (ref 30.0–36.0)

## 2011-03-16 MED ORDER — MORPHINE SULFATE 2 MG/ML IJ SOLN: 1.0000 mg | INTRAMUSCULAR | Status: DC | PRN

## 2011-03-16 MED ORDER — DIPHENHYDRAMINE HCL 25 MG PO CAPS
25.0000 mg | ORAL_CAPSULE | Freq: Four times a day (QID) | ORAL | Status: DC | PRN
  Administered 2011-03-16: 25 mg via ORAL
  Filled 2011-03-16: qty 1

## 2011-03-16 MED ORDER — LORAZEPAM 1 MG PO TABS: 1.0000 mg | ORAL_TABLET | Freq: Every day | ORAL | Status: AC | PRN

## 2011-03-16 MED ORDER — DOXYCYCLINE HYCLATE 100 MG PO CAPS: 100.0000 mg | ORAL_CAPSULE | Freq: Two times a day (BID) | ORAL | Status: AC

## 2011-03-16 MED ORDER — OXYCODONE-ACETAMINOPHEN 5-325 MG PO TABS: 1.0000 | ORAL_TABLET | Freq: Four times a day (QID) | ORAL | Status: DC | PRN

## 2011-03-16 NOTE — Progress Notes (Signed)
Hydrotherapy Evaluation   03/16/2011 Time In: 1322  Time Out:  1424  Pt adm 03/14/2011 with infection of rt middle finger.  Underwent I&D on 03/14/2011.   Past Medical History: back surgery   Evaluation and treatment procedures explained to pt. Pt agreed to evaluation and treatment.  Date of Onset: 03/12/2011  Pain:  10/10 to rt hand with whirlpool and dressing change. Ordered pain meds were causing pt to hallucinate.  Nursing obtaining new orders for different pain meds.  Sensation: WFL per pt report. Drainage:  Moderate serosanguinous Edema: Present Erythema: Present Odor: None Periwound:  Intact   Wound #1 Dorsal surface rt. hand  Length:  6cm Width:   1.8cm Depth:   1 cm Tunneling:   4 cm at 6 o'clock Granulation:  95%  Slough:  5%   Eschar:  %  Wound #2 Rt middle finger  Length: 7 cm Width: 4 cm Depth: .2cm Undermining:  Granulation: 100% Slough: % Eschar: %  Wound #3 Palmar surface rt hand superior  Length: .8 cm Width:  .1cm Depth: .3 cm Undermining:  Granulation: Not able to visualize Eschar:  %   Wound #4 Palmar surface rt hand inferior   Length: 1.5 cm Width: .2 cm Depth: .3 cm Undermining:    Granulation:  Unable to visualize Slough:  % Eschar:  %  Treatment:  Whirlpool x 10 minutes. Dressing:  NS 1/4 inch packing strips, xeroform, dry 4x4's, guaze wrap, ace wrap  Assessment/Recommendations:  Continue hydrotherapy to remove slough and promote healing.  Goals: (Frequency/Duration 6x/wk for 1 wk) (Potential good)  Improve the function of pt's integumentary system by progressing the wounds through the phases of wound healing (inflammation - proliferation - remodeling) by:  #1 Decrease slough to 0% #2 Increase granulation to 100%  #3 Pt will be able to verbalize understanding of follow up recommendations for wounds.  Goals and treatment plan made and agreed with upon by pt Follow/up recommendations:  Home health/ MD  office  Hydrotherapy Plan:  Whirlpool, dressing change, pt/family education.   [employer] PT [phone]

## 2011-03-16 NOTE — Discharge Summary (Signed)
Physician Discharge Summary  Patient ID: ARDENIA STINER MRN: [id number] DOB/AGE: [birth year] 71 y.o.  Admit date: 03/14/2011 Discharge date: 03/16/2011  Primary Care Physician:  Milana Obey, MD, MD   Discharge Diagnoses:    Patient Active Problem List  Diagnoses  . Tendinitis of finger  . GERD (gastroesophageal reflux disease)    Medication List  As of 03/16/2011  5:05 PM   TAKE these medications         doxycycline 100 MG capsule   Commonly known as: VIBRAMYCIN   Take 1 capsule (100 mg total) by mouth 2 (two) times daily.      HYDROcodone-acetaminophen 5-325 MG per tablet   Commonly known as: NORCO   Take 1 tablet by mouth every 6 (six) hours as needed for pain.      LORazepam 1 MG tablet   Commonly known as: ATIVAN   Take 1 tablet (1 mg total) by mouth daily as needed for anxiety.      omeprazole 40 MG capsule   Commonly known as: PRILOSEC   Take 40 mg by mouth at bedtime.      ondansetron 4 MG disintegrating tablet   Commonly known as: ZOFRAN-ODT   Take 1 tablet (4 mg total) by mouth every 8 (eight) hours as needed for nausea.             Disposition and Follow-up:  Follow up with primary MD, and with Dr Merlyn Lot, hand surgeon.  Consults:  Hand surgery  Dr Merlyn Lot.   Significant Diagnostic Studies:  Dg Chest 2 View  03/14/2011  *RADIOLOGY REPORT*  Clinical Data: Preop  CHEST - 2 VIEW  Comparison: 02/09/2011  Findings: Lungs are clear. No pleural effusion or pneumothorax.  Cardiomediastinal silhouette is within normal limits.  Mild degenerative changes of the visualized thoracolumbar spine.  IMPRESSION: No evidence of acute cardiopulmonary disease.  Original Report Authenticated By: Charline Bills, M.D.   Dg Finger Middle Right  03/14/2011  *RADIOLOGY REPORT*  Clinical Data: Right middle finger black/swollen  RIGHT MIDDLE FINGER 2+V  Comparison: 03/13/2011  Findings: No fracture or dislocation is seen.  The joint spaces are preserved. Cortical  irregularity/erosions involving the 3rd proximal/middle phalanx along the ulnar aspect of the third PIP joint.  Moderate soft tissue swelling along the dorsal/medial aspect of the third middle phalanx, new/increased.  IMPRESSION: No fracture or dislocation is seen.  Cortical irregularity/erosions involving the 3rd proximal/middle phalanx along the ulnar aspect of the third PIP joint.  Moderate soft tissue swelling, new/increased.  This appearance is worrisome for infection or less likely an inflammatory arthropathy.  Original Report Authenticated By: Charline Bills, M.D.   Brief H and P: For complete details, refer to admission H and P. However, in brief, this is an 71 y.o. female who was initially seen in the ED at [facility] a day ago with complaints of redness pain and swelling of her right 3rd finger that started as a blister and continued to progress, without antecedent trauma, insect or pet bite to the area. She was evaluated at [facility] and given a dose of IV antibiotic and a prescription to continue as an outpatient on oral Doxycycline. She returned to ED with worsening symptoms, was started on iv Vancomycin and Zosyn and transferred her to [facility] for surgery.   Physical Exam: On 02/24/11. General: Comfortable, alert, communicative, fully oriented, not short of breath at rest.  HEENT: Mild clinical pallor, no jaundice, no conjunctival injection or discharge.  NECK: Supple, JVP  not seen, no carotid bruits, no palpable lymphadenopathy, no palpable goiter.  CHEST: Clinically clear to auscultation, no wheezes, no crackles.  HEART: Sounds 1 and 2 heard, normal, regular, no murmurs.  ABDOMEN: Full, soft, non-tender, no palpable organomegaly, no palpable masses, normal bowel sounds.  GENITALIA: Not examined.  UPPER EXTREMITIES: RUE bandaged from upper third of forearm, to tips of fingers.  LOWER EXTREMITIES: No pitting edema, palpable peripheral pulses.  MUSCULOSKELETAL SYSTEM:  Generalized osteoarthritic changes, otherwise, normal.  CENTRAL NERVOUS SYSTEM: No focal neurologic deficit on gross examination.  Hospital Course:  Principal Problem:  *Tendinitis of finger: The [facility] EDP contacted the Hand Surgeon On call (Dr. Mina Marble), patient was transferred to [facility], and Dr Betha Loa performed irrigation and debridement of infection/tendinitis right 3rd finger, in early AM. Wound cultures grew abundant Staph Aureus. Post operatively, patient felt considerably better, has remained apyrexial and wcc normalized. She underwent hydrotherapy on 03/16/11, and was declared stable for discharge on that date, by Dr Merlyn Lot, on oral Doxycycline, for follow up on an outpatient basis, with hydrotherapy. Clinically, she appears pain-free, and stable. Active Problems:  1.GERD (gastroesophageal reflux disease): This remained asymptomatic, and not problematic.    Comment: Patient was discharged on 03/16/11.  Time spent on Discharge: 35 mins.  Signed: Keandrea Tapley,CHRISTOPHER 03/16/2011, 5:05 PM

## 2011-03-16 NOTE — Progress Notes (Signed)
Subjective: 2 Days Post-Op Procedure(s) (LRB): IRRIGATION AND DEBRIDEMENT EXTREMITY (Right) Patient reports pain as mild.    Objective: Vital signs in last 24 hours: Temp:  [97.5 F (36.4 C)-99.6 F (37.6 C)] 97.5 F (36.4 C) (01/22 1335) Pulse Rate:  [80-89] 85  (01/22 1335) Resp:  [18-20] 18  (01/22 1335) BP: (116-128)/(56-58) 119/58 mmHg (01/22 1335) SpO2:  [96 %-100 %] 100 % (01/22 1335)  Intake/Output from previous day: 01/21 0701 - 01/22 0700 In: 2890 [P.O.:480; I.V.:1960; IV Piggyback:450] Out: -  Intake/Output this shift: Total I/O In: 240 [P.O.:240] Out: -    Basename 03/16/11 0650 03/15/11 0648 03/14/11 1305  HGB 9.3* 9.7* 11.4*    Basename 03/16/11 0650 03/15/11 0648  WBC 7.7 10.9*  RBC 3.11* 3.18*  HCT 27.6* 28.5*  PLT 92* 87*    Basename 03/16/11 0650 03/15/11 0648  NA 131* 132*  K 3.4* 3.5  CL 98 99  CO2 25 25  BUN 11 11  CREATININE 0.70 0.79  GLUCOSE 138* 124*  CALCIUM 8.3* 8.5   No results found for this basename: LABPT:2,INR:2 in the last 72 hours  intact sensation and capillary refill.  wiggles fingers.  no erythema proximal to dressing.  dressing c/d/i  Assessment/Plan: 2 Days Post-Op Procedure(s) (LRB): IRRIGATION AND DEBRIDEMENT EXTREMITY (Right) Much improved.  Feels well.  Would like to go home.  No fevers, chills, sweats.  Follow up 3 days.    Tanya Ellis R 03/16/2011, 3:59 PM
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
## 2011-03-18 ENCOUNTER — Encounter (HOSPITAL_COMMUNITY): Payer: Self-pay | Admitting: *Deleted

## 2011-03-18 ENCOUNTER — Emergency Department (HOSPITAL_COMMUNITY)
Admission: EM | Admit: 2011-03-18 | Discharge: 2011-03-18 | Disposition: A | Discharge status: Home / Self Care | Payer: Medicare Other | Attending: Emergency Medicine | Admitting: Emergency Medicine

## 2011-03-18 DIAGNOSIS — S6990XA Unspecified injury of unspecified wrist, hand and finger(s), initial encounter: Secondary | ICD-10-CM | POA: Insufficient documentation

## 2011-03-18 DIAGNOSIS — X58XXXA Exposure to other specified factors, initial encounter: Secondary | ICD-10-CM | POA: Insufficient documentation

## 2011-03-18 DIAGNOSIS — M79609 Pain in unspecified limb: Secondary | ICD-10-CM | POA: Insufficient documentation

## 2011-03-18 DIAGNOSIS — Z79899 Other long term (current) drug therapy: Secondary | ICD-10-CM | POA: Insufficient documentation

## 2011-03-18 DIAGNOSIS — Z9889 Other specified postprocedural states: Secondary | ICD-10-CM | POA: Insufficient documentation

## 2011-03-18 DIAGNOSIS — Z8612 Personal history of poliomyelitis: Secondary | ICD-10-CM | POA: Insufficient documentation

## 2011-03-18 DIAGNOSIS — K219 Gastro-esophageal reflux disease without esophagitis: Secondary | ICD-10-CM | POA: Insufficient documentation

## 2011-03-18 HISTORY — DX: Disorder of kidney and ureter, unspecified: N28.9

## 2011-03-18 HISTORY — DX: Pneumonia, unspecified organism: J18.9

## 2011-03-18 NOTE — ED Notes (Signed)
?  Brown recluse spider bite to rt hand  .  Sent to  [facility] and had surgery.  Released 1/22  Concerned about pain, and draining.

## 2011-03-18 NOTE — ED Notes (Signed)
Right middle finger and hand redressed with vaseline gauze, kerlix and ace wrap.

## 2011-03-18 NOTE — ED Notes (Signed)
Hand wrapped in ace bandages. Pt is taking antibiotics for hand infection

## 2011-03-18 NOTE — ED Provider Notes (Signed)
History    Scribed for Tanya Lennert, MD, the patient was seen in room APA03/APA03. This chart was scribed by Katha Cabal.   CSN: [id number]  Arrival date & time 03/18/11  4098   First MD Initiated Contact with Patient 03/18/11 1850      Chief Complaint  Patient presents with  . Hand Pain    (Consider location/radiation/quality/duration/timing/severity/associated sxs/prior treatment) Patient is a 71 y.o. female presenting with hand pain. The history is provided by the patient.  Hand Pain This is a recurrent problem. Episode onset: about a week ago. The problem occurs constantly. The problem has been gradually improving. Exacerbated by: certain positions. The symptoms are relieved by nothing. Treatments tried: wound packing    Patient reports moderate to worsening of right hand pain.  Pain rated 7/10 currently. Patient states several hours ago wound began to spontaneously drain.  Patient was seen in ED and transferred for surgery to drain abscess.  Patient was released from [facility] on 03/16/11.  Patient is taking antibiotics.  Wound covered with packing prior to arrival.     PCP Milana Obey, MD, MD  Past Medical History  Diagnosis Date  . [facility] spotted fever   . Lyme disease   . Polio   . Measles   . Mumps   . GERD (gastroesophageal reflux disease)   . Sliding hiatal hernia   . Headache   . Uveitis   . Blind right eye   . Colon polyp   . Renal disorder   . Pneumonia     Past Surgical History  Procedure Date  . Back surgery   . Tonsillectomy   . Adenoidectomy   . I&d extremity 03/14/2011    Procedure: IRRIGATION AND DEBRIDEMENT EXTREMITY;  Surgeon: Tami Ribas, MD;  Location: [facility] OR;  Service: Orthopedics;  Laterality: Right;    Family History  Problem Relation Age of Onset  . Coronary artery disease Father     History  Substance Use Topics  . Smoking status: Never Smoker   . Smokeless tobacco: Not on file  . Alcohol Use: No    OB  History    Grav Para Term Preterm Abortions TAB SAB Ect Mult Living                  Review of Systems  All other systems reviewed and are negative.    Allergies  Sulfa antibiotics  Home Medications   Current Outpatient Rx  Name Route Sig Dispense Refill  . DOXYCYCLINE HYCLATE 100 MG PO CAPS Oral Take 1 capsule (100 mg total) by mouth 2 (two) times daily. 20 capsule 0  . OMEPRAZOLE 40 MG PO CPDR Oral Take 40 mg by mouth at bedtime.    Marland Kitchen ONDANSETRON 4 MG PO TBDP Oral Take 1 tablet (4 mg total) by mouth every 8 (eight) hours as needed for nausea. 10 tablet 0  . HYDROCODONE-ACETAMINOPHEN 5-325 MG PO TABS Oral Take 1 tablet by mouth every 6 (six) hours as needed for pain. 10 tablet 0  . LORAZEPAM 1 MG PO TABS Oral Take 1 tablet (1 mg total) by mouth daily as needed for anxiety. 5 tablet 0    Take prior to hydrotherapy.    BP 140/68  Pulse 64  Temp(Src) 97.7 F (36.5 C) (Oral)  Resp 18  Ht 5\' 4"  (1.626 m)  Wt 134 lb (60.782 kg)  BMI 23.00 kg/m2  SpO2 100%  Physical Exam  Constitutional: She is oriented to person,  place, and time. She appears well-developed.  HENT:  Head: Normocephalic.  Eyes: Conjunctivae are normal.  Neck: No tracheal deviation present.  Musculoskeletal: Normal range of motion.        incision to dorsum of right middle finger that has packing and is healing properly, no signs of infection  Neurological: She is oriented to person, place, and time.  Skin: Skin is warm.  Psychiatric: She has a normal mood and affect.    ED Course  Procedures (including critical care time)   DIAGNOSTIC STUDIES: Oxygen Saturation is 100% on room air, normal by my interpretation.     COORDINATION OF CARE: 7:00 PM  Physical exam complete.  Will have RN redress wound.      LABS / RADIOLOGY:   Labs Reviewed - No data to display No results found.    Hand healing well   MDM        MEDICATIONS GIVEN IN THE E.D. Scheduled Meds:   Continuous Infusions:         IMPRESSION: No diagnosis found.   DISCHARGE MEDICATIONS: New Prescriptions   No medications on file     The chart was scribed for me under my direct supervision.  I personally performed the history, physical, and medical decision making and all procedures in the evaluation of this patient.Tanya Lennert, MD 03/18/11 [phone]

## 2011-03-19 LAB — ANAEROBIC CULTURE

## 2011-04-13 ENCOUNTER — Emergency Department (HOSPITAL_COMMUNITY)
Admission: EM | Admit: 2011-04-13 | Discharge: 2011-04-13 | Disposition: A | Discharge status: Home / Self Care | Payer: Medicare Other | Attending: Emergency Medicine | Admitting: Emergency Medicine

## 2011-04-13 ENCOUNTER — Encounter (HOSPITAL_COMMUNITY): Payer: Self-pay | Admitting: *Deleted

## 2011-04-13 DIAGNOSIS — L819 Disorder of pigmentation, unspecified: Secondary | ICD-10-CM

## 2011-04-13 DIAGNOSIS — R238 Other skin changes: Secondary | ICD-10-CM | POA: Insufficient documentation

## 2011-04-13 DIAGNOSIS — K219 Gastro-esophageal reflux disease without esophagitis: Secondary | ICD-10-CM | POA: Insufficient documentation

## 2011-04-13 DIAGNOSIS — Z79899 Other long term (current) drug therapy: Secondary | ICD-10-CM | POA: Insufficient documentation

## 2011-04-13 NOTE — ED Notes (Signed)
Pt states that she had surgery on 03/14/2011 at [facility] on her right middle finger, pt presents to er today with delay cap refill, color of finger is purple/blue, middle finger and right index finger is cool to touch

## 2011-04-13 NOTE — ED Notes (Signed)
Pt had spider bite to rt hand last month , had surgery for same.  Today noticed that rt middle finger purple and tingling.

## 2011-04-13 NOTE — ED Provider Notes (Signed)
History     CSN: [id number]  Arrival date & time 04/13/11  1221   First MD Initiated Contact with Patient 04/13/11 1337      Chief Complaint  Patient presents with  . Wound Infection    (Consider location/radiation/quality/duration/timing/severity/associated sxs/prior treatment) HPI Comments: Patient with hx of abscess to her right middle finger with surgical debridement last month, c/o sudden onset of discoloration to the entire finger earlier on the day of arrival to the ED.  She states that she noticed her finger "turning purple" and stinging sensation.  She denies recent injury.  She also states that she feels like the finger is cold to touch.  She states the finger is worse when the hand is allowed to hang down and color improves with elevation and movement.  She denies swelling or pain to her other fingers or wrist.   Patient is a 71 y.o. female presenting with wound check. The history is provided by the patient. No language interpreter was used.  Wound Check  Treated in ED: several hours prior to arrival. Treatments since wound repair include oral antibiotics and regular soap and water washings. Fever duration: no fever. There has been no drainage from the wound. There is no redness present. There is no swelling present. The pain has new pain. There is difficulty moving the extremity or digit due to weakness.    Past Medical History  Diagnosis Date  . [facility] spotted fever   . Lyme disease   . Polio   . Measles   . Mumps   . GERD (gastroesophageal reflux disease)   . Sliding hiatal hernia   . Headache   . Uveitis   . Blind right eye   . Colon polyp   . Renal disorder   . Pneumonia   . West Nile fever     Past Surgical History  Procedure Date  . Back surgery   . Tonsillectomy   . Adenoidectomy   . I&d extremity 03/14/2011    Procedure: IRRIGATION AND DEBRIDEMENT EXTREMITY;  Surgeon: Tami Ribas, MD;  Location: [facility] OR;  Service: Orthopedics;  Laterality:  Right;    Family History  Problem Relation Age of Onset  . Coronary artery disease Father     History  Substance Use Topics  . Smoking status: Never Smoker   . Smokeless tobacco: Not on file  . Alcohol Use: No    OB History    Grav Para Term Preterm Abortions TAB SAB Ect Mult Living                  Review of Systems  Constitutional: Negative for fever, chills and appetite change.  Respiratory: Negative for shortness of breath.   Musculoskeletal: Negative.   Skin: Positive for color change.  Neurological: Negative for dizziness, weakness and light-headedness.  All other systems reviewed and are negative.    Allergies  Sulfa antibiotics  Home Medications   Current Outpatient Rx  Name Route Sig Dispense Refill  . AMOXICILLIN 500 MG PO CAPS Oral Take 500 mg by mouth every 8 (eight) hours.    Marland Kitchen VITAMIN B-12 PO Oral Take 1 tablet by mouth daily.    Marland Kitchen HYDROCODONE-ACETAMINOPHEN 5-325 MG PO TABS Oral Take 0.5 tablets by mouth every 6 (six) hours as needed. For pain      BP 122/76  Pulse 82  Temp(Src) 97.8 F (36.6 C) (Oral)  Resp 18  Ht 5\' 4"  (1.626 m)  Wt 136 lb (61.689 kg)  BMI 23.34 kg/m2  SpO2 98%  Physical Exam  Nursing note and vitals reviewed. Constitutional: She is oriented to person, place, and time. She appears well-developed and well-nourished. No distress.  HENT:  Head: Normocephalic and atraumatic.  Mouth/Throat: Oropharynx is clear and moist.  Cardiovascular: Normal rate, regular rhythm, normal heart sounds and intact distal pulses.   No murmur heard. Pulmonary/Chest: Effort normal and breath sounds normal. No respiratory distress.  Musculoskeletal: Normal range of motion. She exhibits tenderness. She exhibits no edema.       See skin exam  Lymphadenopathy:    She has cervical adenopathy.  Neurological: She is alert and oriented to person, place, and time. She exhibits normal muscle tone. Coordination normal.  Skin: Skin is warm and dry.        Bluish discoloration to entire right third finger.  No edema.  Finger is warm to touch, cap refill is slightly delayed but similar to other fingers of the right hand.  Distal sensation intact and patient able to flex the fingers w/o difficulty.  Radial pulse is brisk    ED Course  Procedures (including critical care time)       MDM    1:50 PM  Patient also seen by EDP.  Care plan discussed.  I will consult her surgeon, Dr. Merlyn Lot.  2:00 PM consulted Dr. Merrilee Seashore office.  Spoke with Larita Fife his nurse, Dr. Merrilee Seashore in surgery at present, she relayed information to Dr. Merlyn Lot.  He agrees to see pt in his office tomorrow  Patient advised of consultation and she agrees to f/u with Dr. Merlyn Lot in his office or to return to ER if her sx's worsen.  She verbalized understanding and agreed to the care plan.        Selenia Mihok L. Tenleigh Byer, [place] 04/15/11 2025

## 2011-04-15 NOTE — ED Provider Notes (Signed)
Medical screening examination/treatment/procedure(s) were performed by non-physician practitioner and as supervising physician I was immediately available for consultation/collaboration.   Kraven Calk L Jerrik Housholder, MD 04/15/11 2306 

## 2011-09-17 ENCOUNTER — Emergency Department (HOSPITAL_COMMUNITY)
Admission: EM | Admit: 2011-09-17 | Discharge: 2011-09-17 | Disposition: A | Discharge status: Home / Self Care | Payer: Medicare Other | Attending: Emergency Medicine | Admitting: Emergency Medicine

## 2011-09-17 ENCOUNTER — Encounter (HOSPITAL_COMMUNITY): Payer: Self-pay | Admitting: *Deleted

## 2011-09-17 DIAGNOSIS — K219 Gastro-esophageal reflux disease without esophagitis: Secondary | ICD-10-CM | POA: Insufficient documentation

## 2011-09-17 DIAGNOSIS — I10 Essential (primary) hypertension: Secondary | ICD-10-CM | POA: Insufficient documentation

## 2011-09-17 DIAGNOSIS — Z882 Allergy status to sulfonamides status: Secondary | ICD-10-CM | POA: Insufficient documentation

## 2011-09-17 DIAGNOSIS — T7840XA Allergy, unspecified, initial encounter: Secondary | ICD-10-CM | POA: Insufficient documentation

## 2011-09-17 MED ORDER — FAMOTIDINE 20 MG PO TABS
40.0000 mg | ORAL_TABLET | Freq: Once | ORAL | Status: AC
  Administered 2011-09-17: 40 mg via ORAL
  Filled 2011-09-17: qty 2

## 2011-09-17 MED ORDER — PREDNISONE 20 MG PO TABS
60.0000 mg | ORAL_TABLET | Freq: Once | ORAL | Status: AC
  Administered 2011-09-17: 60 mg via ORAL
  Filled 2011-09-17: qty 3

## 2011-09-17 MED ORDER — DIPHENHYDRAMINE HCL 25 MG PO CAPS
25.0000 mg | ORAL_CAPSULE | Freq: Once | ORAL | Status: AC
  Administered 2011-09-17: 25 mg via ORAL
  Filled 2011-09-17: qty 1

## 2011-09-17 MED ORDER — PREDNISONE 20 MG PO TABS: 40.0000 mg | ORAL_TABLET | Freq: Every day | ORAL | Status: AC

## 2011-09-17 NOTE — ED Notes (Signed)
Pt c/o itching, rash and face and throat swelling. Pt states that she has been taking Cefuroxime since 09/10/11. 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

## 2011-09-17 NOTE — ED Provider Notes (Signed)
History   This chart was scribed for Laray Anger, DO by Shari Heritage. The patient was seen in room APA14/APA14.      CSN: [id number]  Arrival date & time 09/17/11  9147   First MD Initiated Contact with Patient 09/17/11 [phone]      Chief Complaint  Patient presents with  . Allergic Reaction     Patient is a 71 y.o. female presenting with allergic reaction. The history is provided by the patient. No language interpreter was used.  Allergic Reaction  Pt was seen at 0953. Tanya Ellis is a 71 y.o. female who presents to the Emergency Department complaining of gradual onset and persistence of constant diffuse "red rash," "swollen face" and stuffy nose onset yesterday evening. Patient states that the rash has been associated with itching. Patient says that she was exposed to green bamboo for the first time yesterday and thinks it may have caused a skin reaction. Patient also reports that she has been taking Cefuroxime for 1 week. Denies sore throat/dysphagia, no intra-oral edema, no fevers, no CP/SOB, no abd pain, no N/V/D.     Past Medical History  Diagnosis Date  . [facility] spotted fever   . Lyme disease   . Polio   . Measles   . Mumps   . GERD (gastroesophageal reflux disease)   . Sliding hiatal hernia   . Headache   . Uveitis   . Blind right eye   . Colon polyp   . Renal disorder   . Pneumonia   . West Nile fever     Past Surgical History  Procedure Date  . Back surgery   . Tonsillectomy   . Adenoidectomy   . I&d extremity 03/14/2011    Procedure: IRRIGATION AND DEBRIDEMENT EXTREMITY;  Surgeon: Tami Ribas, MD;  Location: [facility] OR;  Service: Orthopedics;  Laterality: Right;    Family History  Problem Relation Age of Onset  . Coronary artery disease Father     History  Substance Use Topics  . Smoking status: Never Smoker   . Smokeless tobacco: Not on file  . Alcohol Use: No    Review of Systems ROS: Statement: All systems negative except as  marked or noted in the HPI; Constitutional: Negative for fever and chills. ; ; Eyes: Negative for eye pain, redness and discharge. ; ; ENMT: +nasal congestion. Negative for ear pain, hoarseness, sinus pressure and sore throat. ; ; Cardiovascular: Negative for chest pain, palpitations, diaphoresis, dyspnea and peripheral edema. ; ; Respiratory: Negative for cough, wheezing and stridor. ; ; Gastrointestinal: Negative for nausea, vomiting, diarrhea, abdominal pain, blood in stool, hematemesis, jaundice and rectal bleeding. . ; ; Genitourinary: Negative for dysuria, flank pain and hematuria. ; ; Musculoskeletal: Negative for back pain and neck pain. Negative for swelling and trauma.; ; Skin: +pruritis, rash. Negative for abrasions, blisters, bruising and skin lesion.; ; Neuro: Negative for headache, lightheadedness and neck stiffness. Negative for weakness, altered level of consciousness , altered mental status, extremity weakness, paresthesias, involuntary movement, seizure and syncope.     Allergies  Sulfa antibiotics  Home Medications   Current Outpatient Rx  Name Route Sig Dispense Refill  . AMOXICILLIN 500 MG PO CAPS Oral Take 500 mg by mouth every 8 (eight) hours.    Marland Kitchen VITAMIN B-12 PO Oral Take 1 tablet by mouth daily.    Marland Kitchen HYDROCODONE-ACETAMINOPHEN 5-325 MG PO TABS Oral Take 0.5 tablets by mouth every 6 (six) hours as needed. For pain  BP 129/71  Pulse 83  Temp 97.9 F (36.6 C) (Oral)  Resp 18  Ht 5\' 4"  (1.626 m)  Wt 143 lb (64.864 kg)  BMI 24.55 kg/m2  SpO2 98%  Physical Exam 0955: Physical examination:  Nursing notes reviewed; Vital signs and O2 SAT reviewed;  Constitutional: Well developed, Well nourished, Well hydrated, In no acute distress; Head:  Normocephalic, atraumatic; Eyes: EOMI, PERRL, No scleral icterus; ENMT: Mouth and pharynx normal, Mucous membranes moist. No intra-oral edema, no hoarse voice, no drooling, no stridor.; Neck: Supple, Full range of motion, No  lymphadenopathy; Cardiovascular: Regular rate and rhythm, No gallop; Respiratory: Breath sounds clear & equal bilaterally, No wheezes.  Speaking full sentences with ease, Normal respiratory effort/excursion; Chest: Nontender, Movement normal; Abdomen: Soft, Nontender, Nondistended, Normal bowel sounds;; Extremities: Pulses normal, No tenderness, 1+ pedal edema bilat, No calf asymmetry.; Neuro: AA&Ox3, Major CN grossly intact.  Speech clear. No gross focal motor or sensory deficits in extremities.; Skin: Color normal, Warm, Dry, +diffuse itchy red rash pt is scratching at during my exam, mild periorbital edema.   ED Course  Procedures    MDM  MDM Reviewed: nursing note and vitals     11:06 AM:  States she feels better and wants to leave "right now."  Rash and itching both appear improved.  No SOB, no hoarse voice.  VS remain stable.  Will have pt stop abx, caution given regarding working with bamboo again.  Dx d/w pt and family.  Questions answered.  Verb understanding, agreeable to d/c home with outpt f/u.  The patient appears reasonably screened and/or stabilized for discharge and I doubt any other medical condition or other [facility] requiring further screening, evaluation, or treatment in the ED at this time prior to discharge.       I personally performed the services described in this documentation, which was scribed in my presence. The recorded information has been reviewed and considered. Mosi Hannold Allison Quarry, DO 09/20/11 1032

## 2011-11-09 ENCOUNTER — Other Ambulatory Visit: Payer: Self-pay

## 2011-11-09 DIAGNOSIS — I83893 Varicose veins of bilateral lower extremities with other complications: Secondary | ICD-10-CM

## 2011-12-03 ENCOUNTER — Encounter: Payer: Self-pay | Admitting: Surgery

## 2011-12-06 ENCOUNTER — Encounter: Payer: Self-pay | Admitting: Surgery

## 2011-12-06 ENCOUNTER — Ambulatory Visit (INDEPENDENT_AMBULATORY_CARE_PROVIDER_SITE_OTHER): Payer: Medicare Other | Admitting: Surgery

## 2011-12-06 ENCOUNTER — Encounter (INDEPENDENT_AMBULATORY_CARE_PROVIDER_SITE_OTHER): Payer: Medicare Other | Admitting: *Deleted

## 2011-12-06 VITALS — BP 157/76 | HR 74 | Resp 16 | Ht 64.0 in | Wt 159.0 lb

## 2011-12-06 DIAGNOSIS — I83893 Varicose veins of bilateral lower extremities with other complications: Secondary | ICD-10-CM

## 2011-12-06 DIAGNOSIS — R609 Edema, unspecified: Secondary | ICD-10-CM

## 2011-12-06 NOTE — Progress Notes (Signed)
[facility] [place]   Patient name: Tanya Ellis MRN: [id number] DOB: [birth year] Sex: female   Referred by: Dr. Sudie Bailey  Reason for referral:  Chief Complaint  Patient presents with  . Varicose Veins    bilateral VV, left worse thatn right/ Dr. John Giovanni    HISTORY OF PRESENT ILLNESS: The patient is referred today for bilateral leg swelling, right greater than left. This has been a chronic problem for her but has gotten worse recently. She states that she spends most of the day on her feet. She has noticed swelling at her ankle where her socks and. She just to the point where her legs can get out from fatigue. They do to 10 2 and 8 at the end of the day. She has not had any ulceration. She does have a history of polio. No history of DVT  Past Medical History  Diagnosis Date  . [facility] spotted fever   . Lyme disease   . Polio   . Measles   . Mumps   . GERD (gastroesophageal reflux disease)   . Sliding hiatal hernia   . Headache   . Uveitis   . Blind right eye   . Colon polyp   . Renal disorder   . Pneumonia   . West Nile fever     Past Surgical History  Procedure Date  . Back surgery   . Tonsillectomy   . Adenoidectomy   . I&d extremity 03/14/2011    Procedure: IRRIGATION AND DEBRIDEMENT EXTREMITY;  Surgeon: Tami Ribas, MD;  Location: [facility] OR;  Service: Orthopedics;  Laterality: Right;    History   Social History  . Marital Status: Widowed    Spouse Name: N/A    Number of Children: N/A  . Years of Education: N/A   Occupational History  . Not on file.   Social History Main Topics  . Smoking status: Never Smoker   . Smokeless tobacco: Never Used  . Alcohol Use: No  . Drug Use: No  . Sexually Active: Not on file   Other Topics Concern  . Not on file   Social History Narrative  . No narrative on file    Family History  Problem Relation Age of Onset  . Coronary artery disease Father   . Heart disease Father    before age 24    Allergies as of 12/06/2011 - Review Complete 12/06/2011  Allergen Reaction Noted  . Sulfa antibiotics Rash 09/05/2010    Current Outpatient Prescriptions on File Prior to Visit  Medication Sig Dispense Refill  . Cyanocobalamin (VITAMIN B-12 PO) Take 1 tablet by mouth daily.      Marland Kitchen HYDROcodone-acetaminophen (NORCO) 5-325 MG per tablet Take 0.5 tablets by mouth every 6 (six) hours as needed. For pain      . nystatin (MYCOSTATIN) 100000 UNIT/ML suspension Take 500,000 Units by mouth 4 (four) times daily.         REVIEW OF SYSTEMS: Cardiovascular: No chest pain, chest pressure, palpitations, orthopnea, or dyspnea on exertion. No claudication or rest pain,  No history of DVT or phlebitis. Positive for swelling in her legs Pulmonary: No productive cough, asthma or wheezing. Neurologic: No weakness, paresthesias, aphasia, or amaurosis. No dizziness. Hematologic: No bleeding problems or clotting disorders. Musculoskeletal: No joint pain or joint swelling. Gastrointestinal: No blood in stool or hematemesis Genitourinary: No dysuria or hematuria. Psychiatric:: No history of major depression. Integumentary: No rashes or ulcers. Constitutional: No fever or chills.  PHYSICAL EXAMINATION: General: The patient appears their stated age.  Vital signs are BP 157/76  Pulse 74  Resp 16  Ht 5\' 4"  (1.626 m)  Wt 159 lb (72.122 kg)  BMI 27.29 kg/m2  SpO2 100% HEENT:  No gross abnormalities Pulmonary: Respirations are non-labored Musculoskeletal: There are no major deformities.   Neurologic: No focal weakness or paresthesias are detected, Skin: There are no ulcer or rashes noted. Psychiatric: The patient has normal affect. Cardiovascular: Palpable pedal pulses. Multiple toe and ectasias and reticular veins in the lower extremity. 2+ edema on the right 1+ on the left  Diagnostic Studies: Duplex ultrasound was ordered and reviewed. This shows the reflux bilaterally with mild  superficial reflux   Medication Changes: 20-30 mm compression stockings prescription was given  Assessment:  Bilateral lower extremity swelling Plan: I feel the patient's symptoms are related to deep system reflux. She has mild superficial system reflux, but is not a candidate for laser ablation given the small size of her saphenous vein. I have told the patient in the best course of action is going to be to treat her with compression stockings. I told her this would not correct her problem however it would help to minimize potential complications in the future. I proceeded is 90 to sleep in these but she should wear them on the days that she is going to be on her feet most of the time. I have given her a prescription for 20-30 mm compression stockings. She will come back to see [place] on an as-needed basis.     Jorge Ny, M.D. [facility] [place] Office: [phone] Pager:  [phone]

## 2012-02-01 ENCOUNTER — Ambulatory Visit (HOSPITAL_COMMUNITY)
Admission: RE | Admit: 2012-02-01 | Discharge: 2012-02-01 | Disposition: A | Discharge status: Home / Self Care | Payer: Medicare Other | Source: Ambulatory Visit | Attending: Family Medicine | Admitting: Family Medicine

## 2012-02-01 ENCOUNTER — Other Ambulatory Visit (HOSPITAL_COMMUNITY): Payer: Self-pay | Admitting: Family Medicine

## 2012-02-01 DIAGNOSIS — J449 Chronic obstructive pulmonary disease, unspecified: Secondary | ICD-10-CM | POA: Insufficient documentation

## 2012-02-01 DIAGNOSIS — M25561 Pain in right knee: Secondary | ICD-10-CM

## 2012-02-01 DIAGNOSIS — M25469 Effusion, unspecified knee: Secondary | ICD-10-CM | POA: Insufficient documentation

## 2012-02-01 DIAGNOSIS — J4489 Other specified chronic obstructive pulmonary disease: Secondary | ICD-10-CM | POA: Insufficient documentation

## 2012-03-06 ENCOUNTER — Ambulatory Visit (HOSPITAL_COMMUNITY)
Admission: RE | Admit: 2012-03-06 | Discharge: 2012-03-06 | Disposition: A | Discharge status: Home / Self Care | Payer: Medicare Other | Source: Ambulatory Visit | Attending: Family Medicine | Admitting: Family Medicine

## 2012-03-06 ENCOUNTER — Other Ambulatory Visit (HOSPITAL_COMMUNITY): Payer: Self-pay | Admitting: Family Medicine

## 2012-03-06 DIAGNOSIS — R0600 Dyspnea, unspecified: Secondary | ICD-10-CM

## 2012-03-06 DIAGNOSIS — R0989 Other specified symptoms and signs involving the circulatory and respiratory systems: Secondary | ICD-10-CM | POA: Insufficient documentation

## 2012-03-06 DIAGNOSIS — R0609 Other forms of dyspnea: Secondary | ICD-10-CM | POA: Insufficient documentation

## 2012-03-13 ENCOUNTER — Other Ambulatory Visit (HOSPITAL_COMMUNITY): Payer: Self-pay | Admitting: Family Medicine

## 2012-03-13 DIAGNOSIS — R109 Unspecified abdominal pain: Secondary | ICD-10-CM

## 2012-03-16 ENCOUNTER — Ambulatory Visit (HOSPITAL_COMMUNITY): Payer: Medicare Other

## 2012-03-21 ENCOUNTER — Ambulatory Visit (HOSPITAL_COMMUNITY)
Admission: RE | Admit: 2012-03-21 | Discharge: 2012-03-21 | Disposition: A | Discharge status: Home / Self Care | Payer: Medicare Other | Source: Ambulatory Visit | Attending: Family Medicine | Admitting: Family Medicine

## 2012-03-21 DIAGNOSIS — R109 Unspecified abdominal pain: Secondary | ICD-10-CM

## 2012-03-21 DIAGNOSIS — K8020 Calculus of gallbladder without cholecystitis without obstruction: Secondary | ICD-10-CM | POA: Insufficient documentation

## 2012-03-21 DIAGNOSIS — K5730 Diverticulosis of large intestine without perforation or abscess without bleeding: Secondary | ICD-10-CM | POA: Insufficient documentation

## 2012-03-21 MED ORDER — IOHEXOL 300 MG/ML  SOLN
100.0000 mL | Freq: Once | INTRAMUSCULAR | Status: AC | PRN
  Administered 2012-03-21: 100 mL via INTRAVENOUS

## 2012-03-25 ENCOUNTER — Encounter (HOSPITAL_COMMUNITY): Payer: Self-pay

## 2012-03-25 ENCOUNTER — Emergency Department (HOSPITAL_COMMUNITY)
Admission: EM | Admit: 2012-03-25 | Discharge: 2012-03-25 | Disposition: A | Discharge status: Home / Self Care | Payer: Medicare Other | Attending: Emergency Medicine | Admitting: Emergency Medicine

## 2012-03-25 DIAGNOSIS — Z87442 Personal history of urinary calculi: Secondary | ICD-10-CM | POA: Insufficient documentation

## 2012-03-25 DIAGNOSIS — Z8619 Personal history of other infectious and parasitic diseases: Secondary | ICD-10-CM | POA: Insufficient documentation

## 2012-03-25 DIAGNOSIS — Z860100 Personal history of colon polyps, unspecified: Secondary | ICD-10-CM | POA: Insufficient documentation

## 2012-03-25 DIAGNOSIS — R112 Nausea with vomiting, unspecified: Secondary | ICD-10-CM

## 2012-03-25 DIAGNOSIS — Z79899 Other long term (current) drug therapy: Secondary | ICD-10-CM | POA: Insufficient documentation

## 2012-03-25 DIAGNOSIS — Z8719 Personal history of other diseases of the digestive system: Secondary | ICD-10-CM | POA: Insufficient documentation

## 2012-03-25 DIAGNOSIS — Z8701 Personal history of pneumonia (recurrent): Secondary | ICD-10-CM | POA: Insufficient documentation

## 2012-03-25 DIAGNOSIS — Z8669 Personal history of other diseases of the nervous system and sense organs: Secondary | ICD-10-CM | POA: Insufficient documentation

## 2012-03-25 DIAGNOSIS — R197 Diarrhea, unspecified: Secondary | ICD-10-CM

## 2012-03-25 DIAGNOSIS — H5440 Blindness, one eye, unspecified eye: Secondary | ICD-10-CM | POA: Insufficient documentation

## 2012-03-25 LAB — CBC WITH DIFFERENTIAL/PLATELET
Basophils Relative: 0 % (ref 0–1)
Eosinophils Absolute: 0 10*3/uL (ref 0.0–0.7)
Eosinophils Relative: 0 % (ref 0–5)
Hemoglobin: 12.6 g/dL (ref 12.0–15.0)
Lymphs Abs: 1 10*3/uL (ref 0.7–4.0)
MCH: 30.8 pg (ref 26.0–34.0)
MCHC: 34 g/dL (ref 30.0–36.0)
MCV: 90.7 fL (ref 78.0–100.0)
Monocytes Relative: 8 % (ref 3–12)
RBC: 4.09 MIL/uL (ref 3.87–5.11)

## 2012-03-25 LAB — URINE MICROSCOPIC-ADD ON

## 2012-03-25 LAB — BASIC METABOLIC PANEL
BUN: 17 mg/dL (ref 6–23)
Calcium: 9.3 mg/dL (ref 8.4–10.5)
Creatinine, Ser: 0.77 mg/dL (ref 0.50–1.10)
GFR calc Af Amer: >90 mL/min (ref >90)
GFR calc non Af Amer: 81 mL/min — ABNORMAL LOW (ref >90)
Glucose, Bld: 179 mg/dL — ABNORMAL HIGH (ref 70–99)

## 2012-03-25 LAB — URINALYSIS, ROUTINE W REFLEX MICROSCOPIC
Bilirubin Urine: NEGATIVE
Protein, ur: NEGATIVE mg/dL
Urobilinogen, UA: 0.2 mg/dL (ref 0.0–1.0)

## 2012-03-25 MED ORDER — ONDANSETRON HCL 4 MG PO TABS: 4.0000 mg | ORAL_TABLET | Freq: Four times a day (QID) | ORAL | Status: DC

## 2012-03-25 MED ORDER — MORPHINE SULFATE 2 MG/ML IJ SOLN
2.0000 mg | Freq: Once | INTRAMUSCULAR | Status: AC
  Administered 2012-03-25: 2 mg via INTRAVENOUS
  Filled 2012-03-25: qty 1

## 2012-03-25 MED ORDER — SODIUM CHLORIDE 0.9 % IV SOLN
Freq: Once | INTRAVENOUS | Status: AC
  Administered 2012-03-25: 06:00:00 via INTRAVENOUS

## 2012-03-25 MED ORDER — ONDANSETRON HCL 4 MG/2ML IJ SOLN
4.0000 mg | Freq: Once | INTRAMUSCULAR | Status: AC
  Administered 2012-03-25: 4 mg via INTRAVENOUS
  Filled 2012-03-25: qty 2

## 2012-03-25 NOTE — ED Notes (Signed)
Pt c/o nausea, vomiting and diarrhea since yesterday.

## 2012-03-25 NOTE — ED Provider Notes (Signed)
History     CSN: [id number]  Arrival date & time 03/25/12  7829   First MD Initiated Contact with Patient 03/25/12 [phone]      Chief Complaint  Patient presents with  . Emesis  . Diarrhea    (Consider location/radiation/quality/duration/timing/severity/associated sxs/prior treatment) HPI Tanya Ellis IS A 72 y.o. female brought in by ambulance to the Emergency Department complaining of nausea, vomiting, diarrhea present since yesterday. She states she started with diarrhea and developed nausea and vomiting. Has been unable to keep anything down. Denies fever, chills.   PCP Dr. Sudie Bailey  Past Medical History  Diagnosis Date  . [facility] spotted fever   . Lyme disease   . Polio   . Measles   . Mumps   . GERD (gastroesophageal reflux disease)   . Sliding hiatal hernia   . Headache   . Uveitis   . Blind right eye   . Colon polyp   . Renal disorder   . Pneumonia   . West Nile fever     Past Surgical History  Procedure Date  . Back surgery   . Tonsillectomy   . Adenoidectomy   . I&d extremity 03/14/2011    Procedure: IRRIGATION AND DEBRIDEMENT EXTREMITY;  Surgeon: Tami Ribas, MD;  Location: [facility] OR;  Service: Orthopedics;  Laterality: Right;    Family History  Problem Relation Age of Onset  . Coronary artery disease Father   . Heart disease Father     before age 70    History  Substance Use Topics  . Smoking status: Never Smoker   . Smokeless tobacco: Never Used  . Alcohol Use: No    OB History    Grav Para Term Preterm Abortions TAB SAB Ect Mult Living                  Review of Systems  Constitutional: Negative for fever.       10 Systems reviewed and are negative for acute change except as noted in the HPI.  HENT: Negative for congestion.   Eyes: Negative for discharge and redness.  Respiratory: Negative for cough and shortness of breath.   Cardiovascular: Negative for chest pain.  Gastrointestinal: Positive for nausea, vomiting and diarrhea.  Negative for abdominal pain.  Musculoskeletal: Negative for back pain.  Skin: Negative for rash.  Neurological: Negative for syncope, numbness and headaches.  Psychiatric/Behavioral:       No behavior change.    Allergies  Sulfa antibiotics  Home Medications   Current Outpatient Rx  Name  Route  Sig  Dispense  Refill  . VITAMIN B-12 PO   Oral   Take 1 tablet by mouth daily.         Marland Kitchen HYDROCODONE-ACETAMINOPHEN 5-325 MG PO TABS   Oral   Take 0.5 tablets by mouth every 6 (six) hours as needed. For pain         . NYSTATIN 100000 UNIT/ML MT SUSP   Oral   Take 500,000 Units by mouth 4 (four) times daily.           BP 132/61  Pulse 101  Temp 99.4 F (37.4 C) (Oral)  Resp 14  Ht 5\' 4"  (1.626 m)  Wt 153 lb (69.4 kg)  BMI 26.26 kg/m2  SpO2 96%  Physical Exam  Nursing note and vitals reviewed. Constitutional:       Awake, alert, nontoxic appearance.  HENT:  Head: Atraumatic.  Eyes: Right eye exhibits no discharge. Left eye  exhibits no discharge.  Neck: Neck supple.  Cardiovascular: Normal heart sounds.   Pulmonary/Chest: Effort normal and breath sounds normal. She exhibits no tenderness.  Abdominal: Soft. Bowel sounds are normal. There is no tenderness. There is no rebound.  Musculoskeletal: She exhibits no tenderness.       Baseline ROM, no obvious new focal weakness.  Neurological:       Mental status and motor strength appears baseline for patient and situation.  Skin: No rash noted.       2+ edema  Psychiatric: She has a normal mood and affect.    ED Course  Procedures (including critical care time) Results for orders placed during the hospital encounter of 03/25/12  CBC WITH DIFFERENTIAL      Component Value Range   WBC 11.6 (*) 4.0 - 10.5 K/uL   RBC 4.09  3.87 - 5.11 MIL/uL   Hemoglobin 12.6  12.0 - 15.0 g/dL   HCT 40.9  81.1 - 91.4 %   MCV 90.7  78.0 - 100.0 fL   MCH 30.8  26.0 - 34.0 pg   MCHC 34.0  30.0 - 36.0 g/dL   RDW 78.2  95.6 - 21.3 %    Platelets 111 (*) 150 - 400 K/uL   Neutrophils Relative 82 (*) 43 - 77 %   Neutro Abs 9.6 (*) 1.7 - 7.7 K/uL   Lymphocytes Relative 9 (*) 12 - 46 %   Lymphs Abs 1.0  0.7 - 4.0 K/uL   Monocytes Relative 8  3 - 12 %   Monocytes Absolute 1.0  0.1 - 1.0 K/uL   Eosinophils Relative 0  0 - 5 %   Eosinophils Absolute 0.0  0.0 - 0.7 K/uL   Basophils Relative 0  0 - 1 %   Basophils Absolute 0.0  0.0 - 0.1 K/uL  BASIC METABOLIC PANEL      Component Value Range   Sodium 134 (*) 135 - 145 mEq/L   Potassium 3.6  3.5 - 5.1 mEq/L   Chloride 100  96 - 112 mEq/L   CO2 23  19 - 32 mEq/L   Glucose, Bld 179 (*) 70 - 99 mg/dL   BUN 17  6 - 23 mg/dL   Creatinine, Ser 0.86  0.50 - 1.10 mg/dL   Calcium 9.3  8.4 - 57.8 mg/dL   GFR calc non Af Amer 81 (*) >90 mL/min   GFR calc Af Amer >90  >90 mL/min    0713 She has taken PO fluids. Stomach is no longer aching.  Labs are normal.  MDM   Patient presents with diarrhea, nausea and vomiting. Gvien IVF, antiemetic and morphine. Pt stable in ED with no significant deterioration in condition.The patient appears reasonably screened and/or stabilized for discharge and I doubt any other medical condition or other [facility] requiring further screening, evaluation, or treatment in the ED at this time prior to discharge.  MDM Reviewed: nursing note and vitals Interpretation: labs          Nicoletta Dress. Colon Branch, MD 03/25/12 [id number]

## 2012-03-25 NOTE — ED Notes (Signed)
Patient given water for po intake.

## 2012-03-25 NOTE — ED Notes (Signed)
Patient stated she has no running water or proper heat in house. States she goes to her neighbors house for bath but has not gone in [employer]

## 2012-03-26 LAB — URINE CULTURE: Colony Count: 8000

## 2012-07-19 ENCOUNTER — Encounter: Payer: Self-pay | Admitting: *Deleted

## 2012-07-20 ENCOUNTER — Ambulatory Visit (INDEPENDENT_AMBULATORY_CARE_PROVIDER_SITE_OTHER): Payer: Medicare Other | Admitting: Obstetrics & Gynecology

## 2012-07-20 ENCOUNTER — Encounter: Payer: Self-pay | Admitting: Obstetrics & Gynecology

## 2012-07-20 VITALS — BP 120/80 | Ht 64.0 in | Wt 160.0 lb

## 2012-07-20 DIAGNOSIS — R102 Pelvic and perineal pain: Secondary | ICD-10-CM

## 2012-07-20 DIAGNOSIS — N9489 Other specified conditions associated with female genital organs and menstrual cycle: Secondary | ICD-10-CM

## 2012-07-20 NOTE — Progress Notes (Signed)
Patient ID: Tanya Ellis, female   DOB: [birth year], 72 y.o.   MRN: [id number] Patient referred over from Dr Sudie Bailey for pelvic pressure for about a year worse since January. 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

## 2012-11-28 ENCOUNTER — Other Ambulatory Visit (HOSPITAL_COMMUNITY): Payer: Self-pay | Admitting: Family Medicine

## 2012-11-28 DIAGNOSIS — Z139 Encounter for screening, unspecified: Secondary | ICD-10-CM

## 2012-12-05 ENCOUNTER — Encounter (INDEPENDENT_AMBULATORY_CARE_PROVIDER_SITE_OTHER): Payer: Self-pay | Admitting: *Deleted

## 2012-12-07 ENCOUNTER — Ambulatory Visit (HOSPITAL_COMMUNITY)
Admission: RE | Admit: 2012-12-07 | Discharge: 2012-12-07 | Disposition: A | Discharge status: Home / Self Care | Payer: Medicare Other | Source: Ambulatory Visit | Attending: Family Medicine | Admitting: Family Medicine

## 2012-12-07 DIAGNOSIS — Z139 Encounter for screening, unspecified: Secondary | ICD-10-CM

## 2012-12-07 DIAGNOSIS — Z1231 Encounter for screening mammogram for malignant neoplasm of breast: Secondary | ICD-10-CM | POA: Insufficient documentation

## 2013-03-25 HISTORY — PX: OTHER SURGICAL HISTORY: SHX169

## 2013-06-05 ENCOUNTER — Encounter: Payer: Self-pay | Admitting: *Deleted

## 2013-06-12 ENCOUNTER — Ambulatory Visit: Payer: Medicare Other | Admitting: Obstetrics & Gynecology

## 2013-08-12 ENCOUNTER — Encounter (HOSPITAL_COMMUNITY): Payer: Self-pay | Admitting: Emergency Medicine

## 2013-08-12 ENCOUNTER — Emergency Department (HOSPITAL_COMMUNITY): Payer: Medicare Other

## 2013-08-12 ENCOUNTER — Inpatient Hospital Stay (HOSPITAL_COMMUNITY)
Admission: EM | Admit: 2013-08-12 | Discharge: 2013-08-13 | DRG: 103 | Disposition: A | Discharge status: Home / Self Care | Payer: Medicare Other | Attending: Internal Medicine | Admitting: Internal Medicine

## 2013-08-12 DIAGNOSIS — R51 Headache: Secondary | ICD-10-CM

## 2013-08-12 DIAGNOSIS — E871 Hypo-osmolality and hyponatremia: Secondary | ICD-10-CM | POA: Diagnosis present

## 2013-08-12 DIAGNOSIS — M778 Other enthesopathies, not elsewhere classified: Secondary | ICD-10-CM

## 2013-08-12 DIAGNOSIS — R609 Edema, unspecified: Secondary | ICD-10-CM

## 2013-08-12 DIAGNOSIS — M779 Enthesopathy, unspecified: Secondary | ICD-10-CM

## 2013-08-12 DIAGNOSIS — G458 Other transient cerebral ischemic attacks and related syndromes: Secondary | ICD-10-CM

## 2013-08-12 DIAGNOSIS — H5440 Blindness, one eye, unspecified eye: Secondary | ICD-10-CM | POA: Diagnosis present

## 2013-08-12 DIAGNOSIS — K219 Gastro-esophageal reflux disease without esophagitis: Secondary | ICD-10-CM | POA: Diagnosis present

## 2013-08-12 DIAGNOSIS — G459 Transient cerebral ischemic attack, unspecified: Secondary | ICD-10-CM

## 2013-08-12 DIAGNOSIS — Z8249 Family history of ischemic heart disease and other diseases of the circulatory system: Secondary | ICD-10-CM

## 2013-08-12 DIAGNOSIS — M1990 Unspecified osteoarthritis, unspecified site: Secondary | ICD-10-CM | POA: Diagnosis present

## 2013-08-12 DIAGNOSIS — R519 Headache, unspecified: Secondary | ICD-10-CM

## 2013-08-12 DIAGNOSIS — Z8612 Personal history of poliomyelitis: Secondary | ICD-10-CM

## 2013-08-12 DIAGNOSIS — I872 Venous insufficiency (chronic) (peripheral): Secondary | ICD-10-CM | POA: Diagnosis present

## 2013-08-12 DIAGNOSIS — Z823 Family history of stroke: Secondary | ICD-10-CM

## 2013-08-12 DIAGNOSIS — I83893 Varicose veins of bilateral lower extremities with other complications: Secondary | ICD-10-CM

## 2013-08-12 DIAGNOSIS — G451 Carotid artery syndrome (hemispheric): Secondary | ICD-10-CM

## 2013-08-12 DIAGNOSIS — G43909 Migraine, unspecified, not intractable, without status migrainosus: Principal | ICD-10-CM | POA: Diagnosis present

## 2013-08-12 HISTORY — DX: Gastric ulcer, unspecified as acute or chronic, without hemorrhage or perforation: K25.9

## 2013-08-12 LAB — URINALYSIS, ROUTINE W REFLEX MICROSCOPIC
BILIRUBIN URINE: NEGATIVE
Glucose, UA: NEGATIVE mg/dL
HGB URINE DIPSTICK: NEGATIVE
KETONES UR: NEGATIVE mg/dL
Leukocytes, UA: NEGATIVE
NITRITE: NEGATIVE
PROTEIN: NEGATIVE mg/dL
SPECIFIC GRAVITY, URINE: 1.015 (ref 1.005–1.030)
UROBILINOGEN UA: 0.2 mg/dL (ref 0.0–1.0)
pH: 7.5 (ref 5.0–8.0)

## 2013-08-12 LAB — CBC WITH DIFFERENTIAL/PLATELET
BASOS ABS: 0 10*3/uL (ref 0.0–0.1)
Basophils Relative: 1 % (ref 0–1)
EOS PCT: 2 % (ref 0–5)
Eosinophils Absolute: 0.1 10*3/uL (ref 0.0–0.7)
HEMATOCRIT: 34.6 % — AB (ref 36.0–46.0)
Hemoglobin: 11.6 g/dL — ABNORMAL LOW (ref 12.0–15.0)
LYMPHS ABS: 2.4 10*3/uL (ref 0.7–4.0)
LYMPHS PCT: 37 % (ref 12–46)
MCH: 30.1 pg (ref 26.0–34.0)
MCHC: 33.5 g/dL (ref 30.0–36.0)
MCV: 89.6 fL (ref 78.0–100.0)
Monocytes Absolute: 0.7 10*3/uL (ref 0.1–1.0)
Monocytes Relative: 11 % (ref 3–12)
NEUTROS ABS: 3.2 10*3/uL (ref 1.7–7.7)
Neutrophils Relative %: 49 % (ref 43–77)
PLATELETS: 128 10*3/uL — AB (ref 150–400)
RBC: 3.86 MIL/uL — AB (ref 3.87–5.11)
RDW: 13.8 % (ref 11.5–15.5)
WBC: 6.4 10*3/uL (ref 4.0–10.5)

## 2013-08-12 LAB — COMPREHENSIVE METABOLIC PANEL
ALK PHOS: 75 U/L (ref 39–117)
ALT: 11 U/L (ref 0–35)
AST: 17 U/L (ref 0–37)
Albumin: 3.7 g/dL (ref 3.5–5.2)
BILIRUBIN TOTAL: 0.6 mg/dL (ref 0.3–1.2)
BUN: 11 mg/dL (ref 6–23)
CALCIUM: 9.2 mg/dL (ref 8.4–10.5)
CHLORIDE: 98 meq/L (ref 96–112)
CO2: 25 meq/L (ref 19–32)
Creatinine, Ser: 0.65 mg/dL (ref 0.50–1.10)
GFR, EST NON AFRICAN AMERICAN: 85 mL/min — AB (ref >90)
GLUCOSE: 116 mg/dL — AB (ref 70–99)
Potassium: 4 mEq/L (ref 3.7–5.3)
SODIUM: 135 meq/L — AB (ref 137–147)
Total Protein: 6.2 g/dL (ref 6.0–8.3)

## 2013-08-12 LAB — PROTIME-INR
INR: 1.02 (ref 0.00–1.49)
Prothrombin Time: 13.2 seconds (ref 11.6–15.2)

## 2013-08-12 MED ORDER — IOHEXOL 350 MG/ML SOLN
100.0000 mL | Freq: Once | INTRAVENOUS | Status: AC | PRN
  Administered 2013-08-12: 100 mL via INTRAVENOUS

## 2013-08-12 MED ORDER — HEPARIN SODIUM (PORCINE) 5000 UNIT/ML IJ SOLN
5000.0000 [IU] | Freq: Three times a day (TID) | INTRAMUSCULAR | Status: DC
Start: 2013-08-12 — End: 2013-08-13
  Administered 2013-08-12 – 2013-08-13 (×2): 5000 [IU] via SUBCUTANEOUS
  Filled 2013-08-12 (×2): qty 1

## 2013-08-12 MED ORDER — ONDANSETRON HCL 4 MG/2ML IJ SOLN
4.0000 mg | Freq: Once | INTRAMUSCULAR | Status: AC
  Administered 2013-08-12: 4 mg via INTRAVENOUS
  Filled 2013-08-12: qty 2

## 2013-08-12 MED ORDER — SODIUM CHLORIDE 0.9 % IV SOLN
INTRAVENOUS | Status: DC
  Administered 2013-08-12: 21:00:00 via INTRAVENOUS

## 2013-08-12 MED ORDER — ACETAMINOPHEN 325 MG PO TABS
650.0000 mg | ORAL_TABLET | Freq: Once | ORAL | Status: AC
  Administered 2013-08-12: 650 mg via ORAL
  Filled 2013-08-12: qty 2

## 2013-08-12 MED ORDER — HYDROCODONE-ACETAMINOPHEN 5-325 MG PO TABS
1.0000 | ORAL_TABLET | Freq: Four times a day (QID) | ORAL | Status: DC | PRN
  Administered 2013-08-12 – 2013-08-13 (×2): 1 via ORAL
  Filled 2013-08-12 (×2): qty 1

## 2013-08-12 MED ORDER — PANTOPRAZOLE SODIUM 40 MG PO TBEC
80.0000 mg | DELAYED_RELEASE_TABLET | Freq: Every day | ORAL | Status: DC
  Administered 2013-08-13 (×2): 80 mg via ORAL
  Filled 2013-08-12 (×3): qty 2

## 2013-08-12 MED ORDER — ASPIRIN 81 MG PO CHEW
324.0000 mg | CHEWABLE_TABLET | Freq: Once | ORAL | Status: AC
  Administered 2013-08-12: 324 mg via ORAL
  Filled 2013-08-12: qty 4

## 2013-08-12 MED ORDER — ACETAMINOPHEN 325 MG PO TABS: 650.0000 mg | ORAL_TABLET | ORAL | Status: DC | PRN

## 2013-08-12 MED ORDER — SODIUM CHLORIDE 0.9 % IV SOLN: INTRAVENOUS | Status: DC

## 2013-08-12 MED ORDER — ASPIRIN 325 MG PO TABS
325.0000 mg | ORAL_TABLET | Freq: Every day | ORAL | Status: DC
  Administered 2013-08-13 (×2): 325 mg via ORAL
  Filled 2013-08-12 (×2): qty 1

## 2013-08-12 MED ORDER — VITAMIN B-12 1000 MCG PO TABS
1000.0000 ug | ORAL_TABLET | Freq: Every day | ORAL | Status: DC
  Administered 2013-08-13 (×2): 1000 ug via ORAL
  Filled 2013-08-12 (×2): qty 1

## 2013-08-12 MED ORDER — MORPHINE SULFATE 2 MG/ML IJ SOLN
2.0000 mg | Freq: Once | INTRAMUSCULAR | Status: AC
  Administered 2013-08-12: 2 mg via INTRAVENOUS
  Filled 2013-08-12: qty 1

## 2013-08-12 MED ORDER — SODIUM CHLORIDE 0.9 % IV BOLUS (SEPSIS)
250.0000 mL | Freq: Once | INTRAVENOUS | Status: AC
  Administered 2013-08-12: 500 mL via INTRAVENOUS

## 2013-08-12 MED ORDER — MORPHINE SULFATE 4 MG/ML IJ SOLN: 4.0000 mg | Freq: Once | INTRAMUSCULAR | Status: DC

## 2013-08-12 NOTE — ED Provider Notes (Signed)
CSN: [id number]     Arrival date & time 08/12/13  1415 History  This chart was scribed for Orpah Greek, by Marlowe Kays, ED Scribe. This patient was seen in room APA03/APA03 and the patient's care was started at 2:35 PM.  Chief Complaint  Patient presents with  . Code Stroke   The history is provided by the patient. No language interpreter was used.   HPI Comments:  Tanya Ellis is a 73 y.o. female who presents to the Emergency Department complaining of severe right-sided HA that started last night. Pt reports associated nausea, diarrhea, tinnitus of the right ear, and chills. She states she took OTC Tylenol yesterday with no relief. Pt denies weakness, vomiting, or fever.   Past Medical History  Diagnosis Date  . [facility] spotted fever   . Lyme disease   . Polio   . Measles   . Mumps   . GERD (gastroesophageal reflux disease)   . Sliding hiatal hernia   . Headache(784.0)   . Uveitis   . Blind right eye   . Colon polyp   . Renal disorder   . Pneumonia   . West Nile fever   . Calculus of gallbladder   . Diverticulosis of colon (without mention of hemorrhage)   . Osteoarthrosis   . Dermatophytosis of nail   . Synovial cyst of popliteal space   . Varicose veins   . Venous insufficiency   . Edema   . Carpal tunnel syndrome   . Disturbance of skin sensation   . Acute poliomyelitis   . Visual loss   . Gastric ulcer    Past Surgical History  Procedure Laterality Date  . Back surgery    . Tonsillectomy    . Adenoidectomy    . I&d extremity  03/14/2011    Procedure: IRRIGATION AND DEBRIDEMENT EXTREMITY;  Surgeon: Tennis Must, MD;  Location: [place];  Service: Orthopedics;  Laterality: Right;   Family History  Problem Relation Age of Onset  . Coronary artery disease Father   . Heart disease Father     before age 44  . Stroke Other    History  Substance Use Topics  . Smoking status: Never Smoker   . Smokeless tobacco: Never Used  . Alcohol Use: No    OB History   Grav Para Term Preterm Abortions TAB SAB Ect Mult Living                 Review of Systems  Constitutional: Positive for chills. Negative for fever.  HENT: Positive for tinnitus.   Gastrointestinal: Positive for nausea and diarrhea. Negative for vomiting.  Neurological: Positive for headaches. Negative for speech difficulty and weakness.    Allergies  Cefuroxime and Sulfa antibiotics  Home Medications   Prior to Admission medications   Medication Sig Start Date End Date Taking? Authorizing Provider  omeprazole (PRILOSEC) 40 MG capsule Take 40 mg by mouth daily.   Yes Historical Provider, MD  Cyanocobalamin (VITAMIN B-12 PO) Take 1 tablet by mouth daily.    Historical Provider, MD  HYDROcodone-acetaminophen (NORCO) 5-325 MG per tablet Take 1 tablet by mouth every 6 (six) hours as needed. For pain    Historical Provider, MD   Triage Vitals: BP 141/81  Pulse 74  Temp(Src) 98.5 F (36.9 C) (Oral)  Resp 18  Ht 5\' 4"  (1.626 m)  Wt 155 lb (70.308 kg)  BMI 26.59 kg/m2  SpO2 96% Physical Exam  Constitutional: She is oriented  to person, place, and time. She appears well-developed and well-nourished. No distress.  HENT:  Head: Normocephalic and atraumatic.  Right Ear: Hearing, tympanic membrane and ear canal normal.  Left Ear: Hearing, tympanic membrane and ear canal normal.  Nose: Nose normal.  Mouth/Throat: Oropharynx is clear and moist and mucous membranes are normal.  Eyes: Conjunctivae and EOM are normal. Pupils are equal, round, and reactive to light.  Neck: Normal range of motion. Neck supple. No Brudzinski's sign and no Kernig's sign noted.  Cardiovascular: Regular rhythm, S1 normal and S2 normal.  Exam reveals no gallop and no friction rub.   No murmur heard. Pulmonary/Chest: Effort normal and breath sounds normal. No respiratory distress. She exhibits no tenderness.  Abdominal: Soft. Normal appearance and bowel sounds are normal. There is no  hepatosplenomegaly. There is no tenderness. There is no rebound, no guarding, no tenderness at McBurney's point and negative Murphy's sign. No hernia.  Musculoskeletal: Normal range of motion.  Neurological: She is alert and oriented to person, place, and time. She has normal strength. No cranial nerve deficit or sensory deficit. Coordination normal. GCS eye subscore is 4. GCS verbal subscore is 5. GCS motor subscore is 6.  Skin: Skin is warm, dry and intact. No rash noted. No cyanosis.  Psychiatric: She has a normal mood and affect. Her speech is normal and behavior is normal. Thought content normal.    ED Course  Procedures (including critical care time) DIAGNOSTIC STUDIES: Oxygen Saturation is 96% on RA, adequate by my interpretation.   COORDINATION OF CARE: 2:38 PM- Will order lab work. Pt verbalizes understanding and agrees to plan.  Medications  sodium chloride 0.9 % bolus 250 mL (0 mLs Intravenous Stopped 08/12/13 1534)  ondansetron (ZOFRAN) injection 4 mg (4 mg Intravenous Given 08/12/13 1502)  morphine 2 MG/ML injection 2 mg (2 mg Intravenous Given 08/12/13 1502)  aspirin chewable tablet 324 mg (324 mg Oral Given 08/12/13 1611)  iohexol (OMNIPAQUE) 350 MG/ML injection 100 mL (100 mLs Intravenous Contrast Given 08/12/13 1652)  acetaminophen (TYLENOL) tablet 650 mg (650 mg Oral Given 08/12/13 1737)    Labs Review Labs Reviewed  CBC WITH DIFFERENTIAL - Abnormal; Notable for the following:    RBC 3.86 (*)    Hemoglobin 11.6 (*)    HCT 34.6 (*)    Platelets 128 (*)    All other components within normal limits  COMPREHENSIVE METABOLIC PANEL - Abnormal; Notable for the following:    Sodium 135 (*)    Glucose, Bld 116 (*)    GFR calc non Af Amer 85 (*)    All other components within normal limits  URINALYSIS, ROUTINE W REFLEX MICROSCOPIC  PROTIME-INR    Imaging Review Ct Angio Head W/cm &/or Wo Cm  08/12/2013   CLINICAL DATA:  Headache.  Right hemi paresis  EXAM: CT ANGIOGRAPHY  HEAD AND NECK  TECHNIQUE: Multidetector CT imaging of the head and neck was performed using the standard protocol during bolus administration of intravenous contrast. Multiplanar CT image reconstructions and MIPs were obtained to evaluate the vascular anatomy. Carotid stenosis measurements (when applicable) are obtained utilizing NASCET criteria, using the distal internal carotid diameter as the denominator.  CONTRAST:  115mL OMNIPAQUE IOHEXOL 350 MG/ML SOLN  COMPARISON:  CT head 08/12/2013  FINDINGS: CTA HEAD FINDINGS  Postcontrast imaging reveals no enhancing lesion. Negative for acute infarct or mass. No hemorrhage identified.  Both vertebral arteries are widely patent to the basilar. PICA is patent bilaterally. The basilar is widely patent.  Superior cerebellar and posterior cerebral arteries are patent bilaterally.  The internal carotid artery is widely patent bilaterally with mild atherosclerotic calcification in the cavernous carotid bilaterally. Anterior and middle cerebral arteries are widely patent without stenosis or filling defect.  Negative for cerebral aneurysm.  Review of the MIP images confirms the above findings.  CTA NECK FINDINGS  Lung apices are clear. No pathologic adenopathy or mass in the neck.  Both carotid arteries are widely patent bilaterally without significant atherosclerotic disease. Negative for carotid dissection.  Both vertebral arteries are widely patent to the basilar without significant stenosis.  Mild cervical spondylosis at C6-7.  Review of the MIP images confirms the above findings.  IMPRESSION: Negative CTA of the head and neck. No significant stenosis in the neck. No significant intracranial stenosis.   Electronically Signed   By: Franchot Gallo M.D.   On: 08/12/2013 18:26   Ct Head Wo Contrast  08/12/2013   CLINICAL DATA:  Right arm numbness, code stroke  EXAM: CT HEAD WITHOUT CONTRAST  TECHNIQUE: Contiguous axial images were obtained from the base of the skull through the  vertex without intravenous contrast.  COMPARISON:  02/09/2011  FINDINGS: Mild diffuse cortical atrophy is stable. No evidence of vascular territory infarct. No evidence of hemorrhage or extra-axial fluid. No mass effect midline shift. Posterior ethmoid air cell opacification on the left side is noted.  IMPRESSION: Age-related atrophy.  No acute intracranial abnormalities.  Critical Value/emergent results were called by telephone at the time of interpretation on 08/12/2013 at 3:24 PM to Dr. Joseph Berkshire , who verbally acknowledged these results.   Electronically Signed   By: Skipper Cliche M.D.   On: 08/12/2013 15:25     EKG Interpretation   Date/Time:  Sunday August 12 2013 15:15:57 EDT Ventricular Rate:  61 PR Interval:  137 QRS Duration: 76 QT Interval:  446 QTC Calculation: 449 R Axis:   60 Text Interpretation:  Sinus rhythm Normal ECG Confirmed by POLLINA  MD,  CHRISTOPHER [phone]) on 08/12/2013 3:54:04 PM      MDM   Final diagnoses:  None   headache  TIA   Patient presented to the ER with complaints of headache, nausea, vomiting and diarrhea. Symptoms began last night. She is experiencing increasing headache today which brought her to the ER. She has had chills, but has not been able to take her temperature. Examination reveals no neck stiffness, negative meningismus signs. She is afebrile upon arrival. I do not suspect meningitis. It was thought that she might be experiencing a viral illness based on the constellation of symptoms.  Approximately 1505 she started to complain of right arm weakness. A repeat examination revealed nearly complete paralysis of the right upper extremity. She was able to wiggle her fingertips only, cannot lift arm against gravity. A code stroke was called. CT scan did not show any acute abnormality. Patient continued to improve, was almost at her normal baseline by the time teleneurology consult was obtained. TPA was not recommended. CT angiography of  head and neck was recommended, and these were negative.  Patient's headache is improved after morphine and Tylenol. She does not remember that she has a distant history of migraines, but not for approximately 30 years. This could have been a complex migraine, but based on denseness of her neurologic symptoms today, TIA is certainly considered. Neurology has recommended admission for TIA workup.  I personally performed the services described in this documentation, which was scribed in my presence. The recorded information  has been reviewed and is accurate.    Orpah Greek, MD 08/12/13 [phone]

## 2013-08-12 NOTE — ED Notes (Signed)
Pt requesting something to eat,

## 2013-08-12 NOTE — ED Notes (Addendum)
Pt returned from ct scan with RN, able to have better movement of right arm, grip weaker in right arm than left. Pt states that her right arm is tingling now,

## 2013-08-12 NOTE — H&P (Signed)
[facility] History and Physical  Tanya Ellis OVA:[id number] DOB: January 10, 1941 DOA: 08/12/2013  Referring physician: Malachy Moan, MD PCP: Robert Bellow, MD   Chief Complaint: Headache  HPI: Tanya Ellis is a 73 y.o. female with really no significant past history presents to the ED with complaints of headache. Patient was describing the headache as pounding on the right side. Patient states that she had the headache since last night. She states that she was unable to get any relied with tylenol. Patient was seen in the ED to develop onset of weakness of the right arm. She describes a tingling sensation and she was unable to move the arm. Patient is now fully recovered. She has never had a prior stroke. Patient states that her headache is also slowly improving. She had no leg weakness. No seizure activity she is alert and oriented.   Review of Systems:  Constitutional:  No weight loss, night sweats, Fevers, chills, fatigue.  HEENT:  ++headaches, no Difficulty swallowing  No sneezing, itching, ear ache, nasal congestion, post nasal drip,  Cardio-vascular:  No chest pain, Orthopnea, PND, palpitations  GI:  No heartburn, indigestion, abdominal pain, nausea, vomiting, diarrhea  Resp:  No shortness of breath with exertion or at rest. no productive cough,No coughing up of blood.  Skin:  no rash or lesions.  GU:  no dysuria, change in color of urine, no urgency or frequency Musculoskeletal:  No joint pain or swelling. No decreased range of motion.  Psych:  No change in mood or affect. No depression or anxiety. No memory loss.   Past Medical History  Diagnosis Date  . [facility] spotted fever   . Lyme disease   . Polio   . Measles   . Mumps   . GERD (gastroesophageal reflux disease)   . Sliding hiatal hernia   . Headache(784.0)   . Uveitis   . Blind right eye   . Colon polyp   . Renal disorder   . Pneumonia   . West Nile fever   . Calculus of gallbladder   .  Diverticulosis of colon (without mention of hemorrhage)   . Osteoarthrosis   . Dermatophytosis of nail   . Synovial cyst of popliteal space   . Varicose veins   . Venous insufficiency   . Edema   . Carpal tunnel syndrome   . Disturbance of skin sensation   . Acute poliomyelitis   . Visual loss   . Gastric ulcer    Past Surgical History  Procedure Laterality Date  . Back surgery    . Tonsillectomy    . Adenoidectomy    . I&d extremity  03/14/2011    Procedure: IRRIGATION AND DEBRIDEMENT EXTREMITY;  Surgeon: Tennis Must, MD;  Location: [place];  Service: Orthopedics;  Laterality: Right;   Social History:  reports that she has never smoked. She has never used smokeless tobacco. She reports that she does not drink alcohol or use illicit drugs.  Allergies  Allergen Reactions  . Cefuroxime   . Sulfa Antibiotics Rash    Family History  Problem Relation Age of Onset  . Coronary artery disease Father   . Heart disease Father     before age 64  . Stroke Other      Prior to Admission medications   Medication Sig Start Date End Date Taking? Authorizing Provider  omeprazole (PRILOSEC) 40 MG capsule Take 40 mg by mouth daily.   Yes Historical Provider, MD  Cyanocobalamin (VITAMIN B-12  PO) Take 1 tablet by mouth daily.    Historical Provider, MD  HYDROcodone-acetaminophen (NORCO) 5-325 MG per tablet Take 1 tablet by mouth every 6 (six) hours as needed. For pain    Historical Provider, MD   Physical Exam: Filed Vitals:   08/12/13 1900  BP: 135/75  Pulse:   Temp:   Resp: 17    BP 135/75  Pulse 70  Temp(Src) 97.8 F (36.6 C) (Oral)  Resp 17  Ht 5\' 4"  (1.626 m)  Wt 70.308 kg (155 lb)  BMI 26.59 kg/m2  SpO2 96%  General:  Appears calm and comfortable Eyes: PERRL, normal lids, irises & conjunctiva ENT: grossly normal hearing, lips & tongue Neck: no LAD, masses or thyromegaly Cardiovascular: RRR, no m/r/g. No LE edema. Respiratory: CTA bilaterally, no w/r/r. Normal  respiratory effort. Abdomen: soft, ntnd Skin: no rash or induration seen on limited exam Musculoskeletal: grossly normal tone BUE/BLE Psychiatric: grossly normal mood and affect, speech fluent and appropriate Neurologic: grossly non-focal though slight decrease in left hand strength very subtle          Labs on Admission:  Basic Metabolic Panel:  Recent Labs Lab 08/12/13 1455  NA 135*  K 4.0  CL 98  CO2 25  GLUCOSE 116*  BUN 11  CREATININE 0.65  CALCIUM 9.2   Liver Function Tests:  Recent Labs Lab 08/12/13 1455  AST 17  ALT 11  ALKPHOS 75  BILITOT 0.6  PROT 6.2  ALBUMIN 3.7   No results found for this basename: LIPASE, AMYLASE,  in the last 168 hours No results found for this basename: AMMONIA,  in the last 168 hours CBC:  Recent Labs Lab 08/12/13 1455  WBC 6.4  NEUTROABS 3.2  HGB 11.6*  HCT 34.6*  MCV 89.6  PLT 128*   Cardiac Enzymes: No results found for this basename: CKTOTAL, CKMB, CKMBINDEX, TROPONINI,  in the last 168 hours  BNP (last 3 results) No results found for this basename: PROBNP,  in the last 8760 hours CBG: No results found for this basename: GLUCAP,  in the last 168 hours  Radiological Exams on Admission: Ct Angio Head W/cm &/or Wo Cm  08/12/2013   CLINICAL DATA:  Headache.  Right hemi paresis  EXAM: CT ANGIOGRAPHY HEAD AND NECK  TECHNIQUE: Multidetector CT imaging of the head and neck was performed using the standard protocol during bolus administration of intravenous contrast. Multiplanar CT image reconstructions and MIPs were obtained to evaluate the vascular anatomy. Carotid stenosis measurements (when applicable) are obtained utilizing NASCET criteria, using the distal internal carotid diameter as the denominator.  CONTRAST:  169mL OMNIPAQUE IOHEXOL 350 MG/ML SOLN  COMPARISON:  CT head 08/12/2013  FINDINGS: CTA HEAD FINDINGS  Postcontrast imaging reveals no enhancing lesion. Negative for acute infarct or mass. No hemorrhage identified.   Both vertebral arteries are widely patent to the basilar. PICA is patent bilaterally. The basilar is widely patent. Superior cerebellar and posterior cerebral arteries are patent bilaterally.  The internal carotid artery is widely patent bilaterally with mild atherosclerotic calcification in the cavernous carotid bilaterally. Anterior and middle cerebral arteries are widely patent without stenosis or filling defect.  Negative for cerebral aneurysm.  Review of the MIP images confirms the above findings.  CTA NECK FINDINGS  Lung apices are clear. No pathologic adenopathy or mass in the neck.  Both carotid arteries are widely patent bilaterally without significant atherosclerotic disease. Negative for carotid dissection.  Both vertebral arteries are widely patent to the basilar  without significant stenosis.  Mild cervical spondylosis at C6-7.  Review of the MIP images confirms the above findings.  IMPRESSION: Negative CTA of the head and neck. No significant stenosis in the neck. No significant intracranial stenosis.   Electronically Signed   By: Franchot Gallo M.D.   On: 08/12/2013 18:26   Ct Head Wo Contrast  08/12/2013   CLINICAL DATA:  Right arm numbness, code stroke  EXAM: CT HEAD WITHOUT CONTRAST  TECHNIQUE: Contiguous axial images were obtained from the base of the skull through the vertex without intravenous contrast.  COMPARISON:  02/09/2011  FINDINGS: Mild diffuse cortical atrophy is stable. No evidence of vascular territory infarct. No evidence of hemorrhage or extra-axial fluid. No mass effect midline shift. Posterior ethmoid air cell opacification on the left side is noted.  IMPRESSION: Age-related atrophy.  No acute intracranial abnormalities.  Critical Value/emergent results were called by telephone at the time of interpretation on 08/12/2013 at 3:24 PM to Dr. Joseph Berkshire , who verbally acknowledged these results.   Electronically Signed   By: Skipper Cliche M.D.   On: 08/12/2013 15:25      Assessment/Plan Active Problems:   TIA (transient ischemic attack)   Headache   1. TIA -code stroke was called and tele-neurology was consulted.  -patient will be admitted here for workup -will get carotid studies -will get echo in am -will get MRI/MRA  2. Headache -?if this could be related to the weakness as a migraine variant -pain control  3. Hyponatremia -will place on IVF NS at 50 cc/hr    Code Status: Full Code (must indicate code status--if unknown or must be presumed, indicate so) Family Communication: None (indicate person spoken with, if applicable, with phone number if by telephone) Disposition Plan: Home (indicate anticipated LOS)  Time spent: 25min  KHAN,SAADAT A [facility] Pager [phone]  **Disclaimer: This note may have been dictated with voice recognition software. Similar sounding words can inadvertently be transcribed and this note may contain transcription errors which may not have been corrected upon publication of note.**

## 2013-08-12 NOTE — ED Notes (Signed)
Pt remains in ct 

## 2013-08-12 NOTE — ED Notes (Signed)
Tele neuro being performed at present time, family at bedside,

## 2013-08-12 NOTE — ED Notes (Addendum)
Pt still complains of tingling to right fingers but is able to move her right arm, no drift noted, able to touch her nose with right finger without any problems,

## 2013-08-12 NOTE — ED Notes (Signed)
While assisting pt in room with hair bands, pt started to complain that she was not able to move right arm, pt had previously had grips equal bilateral, but pt was unable to lift right arm, sensation was same bilateral, speech clear, face is symmetrical, pt able to move lower extremities without any deficits noted, Dr Betsey Holiday notified, in room to exam pt,

## 2013-08-12 NOTE — ED Notes (Signed)
Dr Pollina at bedside,  

## 2013-08-12 NOTE — ED Notes (Signed)
Pt states that at times she has not felt right for the past two months, will "walk backwards"

## 2013-08-12 NOTE — ED Notes (Signed)
Pt states that she has trouble swallowing tablets and capsules, when asked about if this is new or old, pt states "I have always had trouble with medicine but not foot",. When RN swallow screen performed pt did not mention anything about problems with medications when asked about problems swallowing,

## 2013-08-12 NOTE — ED Notes (Signed)
Pt sitting up in bed, reading a magazine, still complains of tingling to right fingertips, movement and sensation area intact.

## 2013-08-12 NOTE — ED Notes (Signed)
Pt c/o headache with n/v that started 26 hours ago in back of head area and radiates around to front and side of head area, pt alert, able to answer questions, denies any weakness, face is symmetrical, grips equal bilateral, pt able to undress herself and get into bed by herself.

## 2013-08-12 NOTE — ED Notes (Addendum)
Pt returns from ct scan, requesting medication for headache, pt assisted to bedside commode and tolerated well, Dr Betsey Holiday notified of pt's request,

## 2013-08-12 NOTE — ED Notes (Addendum)
Pt reports chills,n/v,headache since yesterday. Pt denies any one-sided weakness or changes in vision. Pt alert and oriented,speech clear. Airway patent. Pt reports taking otc tylenol with no relief.

## 2013-08-12 NOTE — ED Notes (Signed)
Family at bedside. Assisted patient to bedside commode.  Also placed yellow socks on patient. Patient states that  Her head is still hurting at this time.

## 2013-08-13 ENCOUNTER — Inpatient Hospital Stay (HOSPITAL_COMMUNITY): Payer: Medicare Other

## 2013-08-13 ENCOUNTER — Encounter (HOSPITAL_COMMUNITY): Payer: Self-pay | Admitting: Radiology

## 2013-08-13 DIAGNOSIS — G459 Transient cerebral ischemic attack, unspecified: Secondary | ICD-10-CM

## 2013-08-13 LAB — LIPID PANEL
CHOLESTEROL: 176 mg/dL (ref 0–200)
HDL: 58 mg/dL (ref >39)
LDL Cholesterol: 103 mg/dL — ABNORMAL HIGH (ref 0–99)
TRIGLYCERIDES: 77 mg/dL (ref <150)
Total CHOL/HDL Ratio: 3 RATIO
VLDL: 15 mg/dL (ref 0–40)

## 2013-08-13 LAB — RAPID URINE DRUG SCREEN, HOSP PERFORMED
Amphetamines: NOT DETECTED
BARBITURATES: NOT DETECTED
BENZODIAZEPINES: NOT DETECTED
Cocaine: NOT DETECTED
Opiates: POSITIVE — AB
Tetrahydrocannabinol: NOT DETECTED

## 2013-08-13 LAB — HEMOGLOBIN A1C
Hgb A1c MFr Bld: 6.4 % — ABNORMAL HIGH (ref <5.7)
Mean Plasma Glucose: 137 mg/dL — ABNORMAL HIGH (ref <117)

## 2013-08-13 LAB — GLUCOSE, CAPILLARY: GLUCOSE-CAPILLARY: 114 mg/dL — AB (ref 70–99)

## 2013-08-13 MED ORDER — STROKE: EARLY STAGES OF RECOVERY BOOK
Freq: Once | Status: AC
  Administered 2013-08-13: 08:00:00
  Filled 2013-08-13: qty 1

## 2013-08-13 NOTE — Progress Notes (Signed)
IV removed. Discharge instructions reviewed with patient and son. Understanding verbalized. Ready for discharge home.

## 2013-08-13 NOTE — Care Management Note (Signed)
    Page 1 of 1   08/13/2013     1:56:55 PM CARE MANAGEMENT NOTE 08/13/2013  Patient:  EARLYN, SYLVAN   Account Number:  [id number]  Date Initiated:  08/13/2013  Documentation initiated by:  Claretha Cooper  Subjective/Objective Assessment:   Pt admitted from home. She cares for her Daughter who is deaf and son who had a CVA at age 75. States that she does not need HH because he is still quite active. Needs assistance with house and children     Action/Plan:   Private Duty List given to pt.   Anticipated DC Date:  08/13/2013   Anticipated DC Plan:  [place]  CM consult      Choice offered to / List presented to:             Status of service:  Completed, signed off Medicare Important Message given?   (If response is "NO", the following Medicare IM given date fields will be blank) Date Medicare IM given:   Date Additional Medicare IM given:    Discharge Disposition:  HOME/SELF CARE  Per UR Regulation:    If discussed at Long Length of Stay Meetings, dates discussed:    Comments:  08/13/13 Claretha Cooper RN BSN CM

## 2013-08-13 NOTE — Discharge Summary (Signed)
Physician Discharge Summary  Patient ID: Tanya Ellis MRN: [id number] DOB/AGE: 1940/08/21 73 y.o. Primary Care Physician:KNOWLTON,STEPHEN D, MD Admit date: 08/12/2013 Discharge date: 08/13/2013    Discharge Diagnoses:  1. Headache, possibly migraine, resolving. No evidence of CVA. 2. Right arm weakness, resolved within 30 minutes.    Medication List         HYDROcodone-acetaminophen 5-325 MG per tablet  Commonly known as:  NORCO/VICODIN  Take 1 tablet by mouth every 6 (six) hours as needed. For pain     omeprazole 40 MG capsule  Commonly known as:  PRILOSEC  Take 40 mg by mouth daily.     VITAMIN B-12 PO  Take 1 tablet by mouth daily.        Discharged Condition: Stable and improved.    Consults: None.  Significant Diagnostic Studies: Ct Angio Head W/cm &/or Wo Cm  08/12/2013   CLINICAL DATA:  Headache.  Right hemi paresis  EXAM: CT ANGIOGRAPHY HEAD AND NECK  TECHNIQUE: Multidetector CT imaging of the head and neck was performed using the standard protocol during bolus administration of intravenous contrast. Multiplanar CT image reconstructions and MIPs were obtained to evaluate the vascular anatomy. Carotid stenosis measurements (when applicable) are obtained utilizing NASCET criteria, using the distal internal carotid diameter as the denominator.  CONTRAST:  191mL OMNIPAQUE IOHEXOL 350 MG/ML SOLN  COMPARISON:  CT head 08/12/2013  FINDINGS: CTA HEAD FINDINGS  Postcontrast imaging reveals no enhancing lesion. Negative for acute infarct or mass. No hemorrhage identified.  Both vertebral arteries are widely patent to the basilar. PICA is patent bilaterally. The basilar is widely patent. Superior cerebellar and posterior cerebral arteries are patent bilaterally.  The internal carotid artery is widely patent bilaterally with mild atherosclerotic calcification in the cavernous carotid bilaterally. Anterior and middle cerebral arteries are widely patent without stenosis or filling  defect.  Negative for cerebral aneurysm.  Review of the MIP images confirms the above findings.  CTA NECK FINDINGS  Lung apices are clear. No pathologic adenopathy or mass in the neck.  Both carotid arteries are widely patent bilaterally without significant atherosclerotic disease. Negative for carotid dissection.  Both vertebral arteries are widely patent to the basilar without significant stenosis.  Mild cervical spondylosis at C6-7.  Review of the MIP images confirms the above findings.  IMPRESSION: Negative CTA of the head and neck. No significant stenosis in the neck. No significant intracranial stenosis.   Electronically Signed   By: Franchot Gallo M.D.   On: 08/12/2013 18:26   Ct Head Wo Contrast  08/12/2013   CLINICAL DATA:  Right arm numbness, code stroke  EXAM: CT HEAD WITHOUT CONTRAST  TECHNIQUE: Contiguous axial images were obtained from the base of the skull through the vertex without intravenous contrast.  COMPARISON:  02/09/2011  FINDINGS: Mild diffuse cortical atrophy is stable. No evidence of vascular territory infarct. No evidence of hemorrhage or extra-axial fluid. No mass effect midline shift. Posterior ethmoid air cell opacification on the left side is noted.  IMPRESSION: Age-related atrophy.  No acute intracranial abnormalities.  Critical Value/emergent results were called by telephone at the time of interpretation on 08/12/2013 at 3:24 PM to Dr. Joseph Berkshire , who verbally acknowledged these results.   Electronically Signed   By: Skipper Cliche M.D.   On: 08/12/2013 15:25   Mr Jodene Nam Head Wo Contrast  08/13/2013   CLINICAL DATA:  Persistent intense headache.  Right arm weakness.  EXAM: MRI HEAD WITHOUT CONTRAST  MRA HEAD WITHOUT CONTRAST  TECHNIQUE: Multiplanar, multiecho pulse sequences of the brain and surrounding structures were obtained without intravenous contrast. Angiographic images of the head were obtained using MRA technique without contrast.  COMPARISON:  Head CT  08/12/2013  FINDINGS: MRI HEAD FINDINGS  Diffusion imaging does not show any acute or subacute infarction. The brainstem and cerebellum are normal. The cerebral hemispheres are normal except for a few punctate foci of T2 and FLAIR signal in the white matter consistent with minimal small vessel change, less than often seen in healthy individuals of this age. No cortical or large vessel territory insult. No mass lesion, hemorrhage, hydrocephalus or extra-axial collection. There is inflammation in the posterior ethmoid sinus region on the left which could be associated with headache. No pituitary mass. No skull or skullbase lesion.  MRA HEAD FINDINGS  Both internal carotid arteries are widely patent into the brain. The anterior and middle cerebral vessels are patent without proximal stenosis, aneurysm or vascular malformation. Both vertebral arteries are widely patent to the basilar. No basilar stenosis. Posterior circulation branch vessels are normal. 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  Normal MRI of the brain for a person of this age. Minimal punctate white matter foci within the cerebral hemispheres, less than often seen in healthy individuals.  Inflammatory disease in the paranasal sinuses within the left posterior ethmoid region that could be symptomatic.   Electronically Signed   By: Nelson Chimes M.D.   On: 08/13/2013 08:32   Mri Brain Without Contrast  08/13/2013   CLINICAL DATA:  Persistent intense headache.  Right arm weakness.  EXAM: MRI HEAD WITHOUT CONTRAST  MRA HEAD WITHOUT CONTRAST  TECHNIQUE: Multiplanar, multiecho pulse sequences of the brain and surrounding structures were obtained without intravenous contrast. Angiographic images of the head were obtained using MRA technique without contrast.  COMPARISON:  Head CT 08/12/2013  FINDINGS: MRI HEAD FINDINGS  Diffusion imaging does not show any acute or  subacute infarction. The brainstem and cerebellum are normal. The cerebral hemispheres are normal except for a few punctate foci of T2 and FLAIR signal in the white matter consistent with minimal small vessel change, less than often seen in healthy individuals of this age. No cortical or large vessel territory insult. No mass lesion, hemorrhage, hydrocephalus or extra-axial collection. There is inflammation in the posterior ethmoid sinus region on the left which could be associated with headache. No pituitary mass. No skull or skullbase lesion.  MRA HEAD FINDINGS  Both internal carotid arteries are widely patent into the brain. The anterior and middle cerebral vessels are patent without proximal stenosis, aneurysm or vascular malformation. Both vertebral arteries are widely patent to the basilar. No basilar stenosis. Posterior circulation branch vessels are normal. 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  Normal MRI of the brain for a person of this age. Minimal punctate white matter foci within the cerebral hemispheres, less than often seen in healthy individuals.  Inflammatory disease in the paranasal sinuses within the left posterior ethmoid region that could be symptomatic.   Electronically Signed   By: Nelson Chimes M.D.   On: 08/13/2013 08:32    Lab Results: Basic Metabolic Panel:  Recent Labs  08/12/13 1455  NA 135*  K 4.0  CL 98  CO2 25  GLUCOSE 116*  BUN 11  CREATININE 0.65  CALCIUM 9.2   Liver Function Tests:  Recent Labs  08/12/13 1455  AST 17  ALT 11  ALKPHOS 75  BILITOT 0.6  PROT 6.2  ALBUMIN 3.7     CBC:  Recent Labs  08/12/13 1455  WBC 6.4  NEUTROABS 3.2  HGB 11.6*  HCT 34.6*  MCV 89.6  PLT 128[place] Hospital Course: This is a 73 year old lady who presented to the hospital with symptoms of headache and also weakness in the right arm. Please see initial history as  outlined below: Tanya Ellis is a 73 y.o. female with really no significant past history presents to the ED with complaints of headache. Patient was describing the headache as pounding on the right side. Patient states that she had the headache since last night. She states that she was unable to get any relied with tylenol. Patient was seen in the ED to develop onset of weakness of the right arm. She describes a tingling sensation and she was unable to move the arm. Patient is now fully recovered. She has never had a prior stroke. Patient states that her headache is also slowly improving. She had no leg weakness. No seizure activity she is alert and oriented. MRI brain scan does not show any stroke. She feels much improved and the right arm weakness has resolved. She possibly may have had a TIA. MRA is negative for any vascular problem in the brain. She stable for discharge. She'll followup in the office in a couple weeks. Discharge Exam: Blood pressure 115/72, pulse 73, temperature 98.4 F (36.9 C), temperature source Oral, resp. rate 18, height 5\' 4"  (1.626 m), weight 74.798 kg (164 lb 14.4 oz), SpO2 96.00%.  she looks systemically well. Heart sounds are present without murmurs. Lung fields are clear. There are no focal neurological signs. She is alert and oriented.   Disposition:  home.       Discharge Instructions   Diet - low sodium heart healthy    Complete by:  As directed      Increase activity slowly    Complete by:  As directed            Follow-up Information   Follow up with Robert Bellow, MD. Schedule an appointment as soon as possible for a visit in 2 weeks.   Specialty:  Family Medicine   Contact information:   [address] [place] [address] [phone]       Signed: Doree Albee   08/13/2013, 11:16 AM

## 2013-08-13 NOTE — Progress Notes (Signed)
*  PRELIMINARY RESULTS* Echocardiogram 2D Echocardiogram has been performed.  Tanya Ellis 08/13/2013, 10:38 AM

## 2013-08-13 NOTE — Progress Notes (Signed)
Utilization Review Complete  

## 2013-08-22 ENCOUNTER — Encounter (HOSPITAL_COMMUNITY): Payer: Self-pay | Admitting: Emergency Medicine

## 2013-08-22 ENCOUNTER — Emergency Department (HOSPITAL_COMMUNITY)
Admission: EM | Admit: 2013-08-22 | Discharge: 2013-08-22 | Disposition: A | Discharge status: Home / Self Care | Payer: Medicare Other | Attending: Emergency Medicine | Admitting: Emergency Medicine

## 2013-08-22 ENCOUNTER — Emergency Department (HOSPITAL_COMMUNITY): Payer: Medicare Other

## 2013-08-22 DIAGNOSIS — Z860100 Personal history of colon polyps, unspecified: Secondary | ICD-10-CM | POA: Insufficient documentation

## 2013-08-22 DIAGNOSIS — K219 Gastro-esophageal reflux disease without esophagitis: Secondary | ICD-10-CM | POA: Insufficient documentation

## 2013-08-22 DIAGNOSIS — R51 Headache: Secondary | ICD-10-CM | POA: Diagnosis not present

## 2013-08-22 DIAGNOSIS — Z8701 Personal history of pneumonia (recurrent): Secondary | ICD-10-CM | POA: Insufficient documentation

## 2013-08-22 DIAGNOSIS — M67919 Unspecified disorder of synovium and tendon, unspecified shoulder: Secondary | ICD-10-CM | POA: Insufficient documentation

## 2013-08-22 DIAGNOSIS — M5412 Radiculopathy, cervical region: Secondary | ICD-10-CM | POA: Diagnosis not present

## 2013-08-22 DIAGNOSIS — Z79899 Other long term (current) drug therapy: Secondary | ICD-10-CM | POA: Insufficient documentation

## 2013-08-22 DIAGNOSIS — Z8679 Personal history of other diseases of the circulatory system: Secondary | ICD-10-CM | POA: Insufficient documentation

## 2013-08-22 DIAGNOSIS — Z87448 Personal history of other diseases of urinary system: Secondary | ICD-10-CM | POA: Insufficient documentation

## 2013-08-22 DIAGNOSIS — Z8612 Personal history of poliomyelitis: Secondary | ICD-10-CM | POA: Insufficient documentation

## 2013-08-22 DIAGNOSIS — Z8619 Personal history of other infectious and parasitic diseases: Secondary | ICD-10-CM | POA: Insufficient documentation

## 2013-08-22 DIAGNOSIS — M1990 Unspecified osteoarthritis, unspecified site: Secondary | ICD-10-CM | POA: Insufficient documentation

## 2013-08-22 DIAGNOSIS — M719 Bursopathy, unspecified: Principal | ICD-10-CM | POA: Insufficient documentation

## 2013-08-22 DIAGNOSIS — M7581 Other shoulder lesions, right shoulder: Secondary | ICD-10-CM

## 2013-08-22 DIAGNOSIS — R209 Unspecified disturbances of skin sensation: Secondary | ICD-10-CM | POA: Diagnosis present

## 2013-08-22 DIAGNOSIS — H5440 Blindness, one eye, unspecified eye: Secondary | ICD-10-CM | POA: Insufficient documentation

## 2013-08-22 LAB — CBC WITH DIFFERENTIAL/PLATELET
Basophils Absolute: 0 10*3/uL (ref 0.0–0.1)
Basophils Relative: 1 % (ref 0–1)
Eosinophils Absolute: 0.1 10*3/uL (ref 0.0–0.7)
Eosinophils Relative: 3 % (ref 0–5)
HCT: 37.6 % (ref 36.0–46.0)
Hemoglobin: 12.4 g/dL (ref 12.0–15.0)
LYMPHS PCT: 41 % (ref 12–46)
Lymphs Abs: 2.3 10*3/uL (ref 0.7–4.0)
MCH: 29.9 pg (ref 26.0–34.0)
MCHC: 33 g/dL (ref 30.0–36.0)
MCV: 90.6 fL (ref 78.0–100.0)
Monocytes Absolute: 0.4 10*3/uL (ref 0.1–1.0)
Monocytes Relative: 8 % (ref 3–12)
NEUTROS PCT: 47 % (ref 43–77)
Neutro Abs: 2.7 10*3/uL (ref 1.7–7.7)
PLATELETS: 134 10*3/uL — AB (ref 150–400)
RBC: 4.15 MIL/uL (ref 3.87–5.11)
RDW: 13.9 % (ref 11.5–15.5)
WBC: 5.7 10*3/uL (ref 4.0–10.5)

## 2013-08-22 LAB — BASIC METABOLIC PANEL
Anion gap: 12 (ref 5–15)
BUN: 9 mg/dL (ref 6–23)
CO2: 26 mEq/L (ref 19–32)
Calcium: 9.4 mg/dL (ref 8.4–10.5)
Chloride: 102 mEq/L (ref 96–112)
Creatinine, Ser: 0.69 mg/dL (ref 0.50–1.10)
GFR calc Af Amer: >90 mL/min (ref >90)
GFR, EST NON AFRICAN AMERICAN: 83 mL/min — AB (ref >90)
Glucose, Bld: 119 mg/dL — ABNORMAL HIGH (ref 70–99)
POTASSIUM: 4 meq/L (ref 3.7–5.3)
SODIUM: 140 meq/L (ref 137–147)

## 2013-08-22 MED ORDER — KETOROLAC TROMETHAMINE 30 MG/ML IJ SOLN
30.0000 mg | Freq: Once | INTRAMUSCULAR | Status: AC
  Administered 2013-08-22: 30 mg via INTRAVENOUS
  Filled 2013-08-22: qty 1

## 2013-08-22 MED ORDER — METHYLPREDNISOLONE (PAK) 4 MG PO TABS: ORAL_TABLET | ORAL | Status: DC

## 2013-08-22 MED ORDER — SODIUM CHLORIDE 0.9 % IV BOLUS (SEPSIS)
1000.0000 mL | Freq: Once | INTRAVENOUS | Status: AC
  Administered 2013-08-22: 1000 mL via INTRAVENOUS

## 2013-08-22 MED ORDER — HYDROCODONE-ACETAMINOPHEN 5-325 MG PO TABS: 1.0000 | ORAL_TABLET | Freq: Four times a day (QID) | ORAL | Status: DC | PRN

## 2013-08-22 NOTE — ED Notes (Signed)
Patient headache started a few days ago. Per patient was seen here in ER and admitted. Per patient diagnosis was heat stroke. Patient reports starting to have numbness in hands and face (around mouth) 45 minutes ago. Patient instructed to come here by Dr Karie Kirks.

## 2013-08-22 NOTE — Discharge Instructions (Signed)
Cervical Radiculopathy Cervical radiculopathy happens when a nerve in the neck is pinched or bruised by a slipped (herniated) disk or by arthritic changes in the bones of the cervical spine. This can occur due to an injury or as part of the normal aging process. Pressure on the cervical nerves can cause pain or numbness that runs from your neck all the way down into your arm and fingers. CAUSES  There are many possible causes, including:  Injury.  Muscle tightness in the neck from overuse.  Swollen, painful joints (arthritis).  Breakdown or degeneration in the bones and joints of the spine (spondylosis) due to aging.  Bone spurs that may develop near the cervical nerves. SYMPTOMS  Symptoms include pain, weakness, or numbness in the affected arm and hand. Pain can be severe or irritating. Symptoms may be worse when extending or turning the neck. DIAGNOSIS  Your caregiver will ask about your symptoms and do a physical exam. He or she may test your strength and reflexes. X-rays, CT scans, and MRI scans may be needed in cases of injury or if the symptoms do not go away after a period of time. Electromyography (EMG) or nerve conduction testing may be done to study how your nerves and muscles are working. TREATMENT  Your caregiver may recommend certain exercises to help relieve your symptoms. Cervical radiculopathy can, and often does, get better with time and treatment. If your problems continue, treatment options may include:  Wearing a soft collar for short periods of time.  Physical therapy to strengthen the neck muscles.  Medicines, such as nonsteroidal anti-inflammatory drugs (NSAIDs), oral corticosteroids, or spinal injections.  Surgery. Different types of surgery may be done depending on the cause of your problems. HOME CARE INSTRUCTIONS   Put ice on the affected area.  Put ice in a plastic bag.  Place a towel between your skin and the bag.  Leave the ice on for 15-20 minutes,  03-04 times a day or as directed by your caregiver.  If ice does not help, you can try using heat. Take a warm shower or bath, or use a hot water bottle as directed by your caregiver.  You may try a gentle neck and shoulder massage.  Use a flat pillow when you sleep.  Only take over-the-counter or prescription medicines for pain, discomfort, or fever as directed by your caregiver.  If physical therapy was prescribed, follow your caregiver's directions.  If a soft collar was prescribed, use it as directed. SEEK IMMEDIATE MEDICAL CARE IF:   Your pain gets much worse and cannot be controlled with medicines.  You have weakness or numbness in your hand, arm, face, or leg.  You have a high fever or a stiff, rigid neck.  You lose bowel or bladder control (incontinence).  You have trouble with walking, balance, or speaking. MAKE SURE YOU:   Understand these instructions.  Will watch your condition.  Will get help right away if you are not doing well or get worse. Document Released: 11/03/2000 Document Revised: 05/03/2011 Document Reviewed: 09/22/2010 [facility] Patient Information 2015 [place], [place]. This information is not intended to replace advice given to you by your health care provider. Make sure you discuss any questions you have with your health care provider.  Rotator Cuff Tendinitis  Rotator cuff tendinitis is inflammation of the tough, cord-like bands that connect muscle to bone (tendons) in your rotator cuff. Your rotator cuff is the collection of all the muscles and tendons that connect your arm to  your shoulder. Your rotator cuff holds the head of your upper arm bone (humerus) in the cup (fossa) of your shoulder blade (scapula). CAUSES Rotator cuff tendinitis is usually caused by overusing the joint involved.  SIGNS AND SYMPTOMS  Deep ache in the shoulder also felt on the outside upper arm over the shoulder muscle.  Point tenderness over the area that is  injured.  Pain comes on gradually and becomes worse with lifting the arm to the side (abduction) or turning it inward (internal rotation).  May lead to a chronic tear: When a rotator cuff tendon becomes inflamed, it runs the risk of losing its blood supply, causing some tendon fibers to die. This increases the risk that the tendon can fray and partially or completely tear. DIAGNOSIS Rotator cuff tendinitis is diagnosed by taking a medical history, performing a physical exam, and reviewing results of imaging exams. The medical history is useful to help determine the type of rotator cuff injury. The physical exam will include looking at the injured shoulder, feeling the injured area, and watching you do range-of-motion exercises. X-ray exams are typically done to rule out other causes of shoulder pain, such as fractures. MRI is the imaging exam usually used for significant shoulder injuries. Sometimes a dye study called CT arthrogram is done, but it is not as widely used as MRI. In some institutions, special ultrasound tests may also be used to aid in the diagnosis. TREATMENT  Less Severe Cases  Use of a sling to rest the shoulder for a short period of time. Prolonged use of the sling can cause stiffness, weakness, and loss of motion of the shoulder joint.  Anti-inflammatory medicines, such as ibuprofen or naproxen sodium, may be prescribed. More Severe Cases  Physical therapy.  Use of steroid injections into the shoulder joint.  Surgery. HOME CARE INSTRUCTIONS   Use a sling or splint until the pain decreases. Prolonged use of the sling can cause stiffness, weakness, and loss of motion of the shoulder joint.  Apply ice to the injured area:  Put ice in a plastic bag.  Place a towel between your skin and the bag.  Leave the ice on for 20 minutes, 2-3 times a day.  Try to avoid use other than gentle range of motion while your shoulder is painful. Use the shoulder and exercise only as  directed by your health care provider. Stop exercises or range of motion if pain or discomfort increases, unless directed otherwise by your health care provider.  Only take over-the-counter or prescription medicines for pain, discomfort, or fever as directed by your health care provider.  If you were given a shoulder sling and straps (immobilizer), do not remove it except as directed, or until you see a health care provider for a follow-up exam. If you need to remove it, move your arm as little as possible or as directed.  You may want to sleep on several pillows at night to lessen swelling and pain. SEEK IMMEDIATE MEDICAL CARE IF:   Your shoulder pain increases or new pain develops in your arm, hand, or fingers and is not relieved with medicines.  You have new, unexplained symptoms, especially increased numbness in the hands or loss of strength.  You develop any worsening of the problems that brought you in for care.  Your arm, hand, or fingers are numb or tingling.  Your arm, hand, or fingers are swollen, painful, or turn white or blue. MAKE SURE YOU:  Understand these instructions.  Will watch  your condition.  Will get help right away if you are not doing well or get worse. Document Released: 05/01/2003 Document Revised: 11/29/2012 Document Reviewed: 09/20/2012 [facility] Patient Information 2015 [place], [place]. This information is not intended to replace advice given to you by your health care provider. Make sure you discuss any questions you have with your health care provider.

## 2013-08-22 NOTE — ED Provider Notes (Addendum)
This chart was scribed for [place], DO by Lowella Petties, ED Scribe. The patient was seen in room APA05/APA05. Patient's care was started at 6:06 PM.   CHIEF COMPLAINT: Numbness, Headache  HPI Comments: Tanya Ellis is a 73 y.o. female who presents to the Emergency Department complaining of a moderate HA onset a few days ago, tingling in her right hand and tingling around her lips. She states that aching pain radiates down her right arm and right hand in particular. She reports difficulty moving her right arm secondary to pain. She reports associated neck and upper back pain. She reports being seen here for similar symptoms 10 days ago. She reports Dr. Karie Kirks instructed her to come to the ED today after she told him about her face and arm tingling.  She was recently admitted to the hospital on 6/21 for headache and right arm weakness thought secondary to a migraine. She had an MRI of her brain that showed no acute abnormality.  Patient is an extremely poor historian which limits history.  Patient has had pain in her right shoulder for several months and his pain is worse with movement.She denies any known injury. She states that she will intermittently have numbness in her fourth and fifth digit which she attributes to carpal tunnel but states today it is slightly worse than normal. She states that she feels her grip strength in her right hand is weaker than normal but denies any numbness anywhere else in her arm. She states all of this has been going on for several months. She feels that her arm is weak because of pain. She states that she is having her headache but it is much better than it was previously. No thunderclap headache or sudden onset or worst headache of her life. She denies any head or neck injury. She is not on anticoagulation.  She states the main reason she is here is because she called her primary care physician for this and he instructed her to come to the emergency  department.  ROS: See HPI Constitutional: no fever  Eyes: no drainage  ENT: no runny nose   Cardiovascular:  no chest pain  Resp: no SOB  GI: no vomiting GU: no dysuria Integumentary: no rash  Allergy: no hives  Musculoskeletal: no leg swelling  Neurological: no slurred speech, numbness and tingling in her bilateral upper extremities and face.  ROS otherwise negative  PAST MEDICAL HISTORY/PAST SURGICAL HISTORY:  Past Medical History  Diagnosis Date  . [facility] spotted fever   . Lyme disease   . Polio   . Measles   . Mumps   . GERD (gastroesophageal reflux disease)   . Sliding hiatal hernia   . Headache(784.0)   . Uveitis   . Blind right eye   . Colon polyp   . Renal disorder   . Pneumonia   . West Nile fever   . Calculus of gallbladder   . Diverticulosis of colon (without mention of hemorrhage)   . Osteoarthrosis   . Dermatophytosis of nail   . Synovial cyst of popliteal space   . Varicose veins   . Venous insufficiency   . Edema   . Carpal tunnel syndrome   . Disturbance of skin sensation   . Acute poliomyelitis   . Visual loss   . Gastric ulcer     MEDICATIONS:  Prior to Admission medications   Medication Sig Start Date End Date Taking? Authorizing Provider  Cyanocobalamin (VITAMIN B-12  PO) Take 1 tablet by mouth daily.    Historical Provider, MD  HYDROcodone-acetaminophen (NORCO) 5-325 MG per tablet Take 1 tablet by mouth every 6 (six) hours as needed. For pain    Historical Provider, MD  omeprazole (PRILOSEC) 40 MG capsule Take 40 mg by mouth daily.    Historical Provider, MD    ALLERGIES:  Allergies  Allergen Reactions  . Cefuroxime   . Sulfa Antibiotics Rash    SOCIAL HISTORY:  History  Substance Use Topics  . Smoking status: Never Smoker   . Smokeless tobacco: Never Used  . Alcohol Use: No    FAMILY HISTORY: Family History  Problem Relation Age of Onset  . Coronary artery disease Father   . Heart disease Father     before age 17   . Stroke Other     EXAM: Triage Vitals: BP 142/76  Pulse 71  Temp(Src) 97.7 F (36.5 C) (Oral)  Resp 18  Ht 5\' 4"  (1.626 m)  Wt 164 lb (74.39 kg)  BMI 28.14 kg/m2  SpO2 100% CONSTITUTIONAL: Alert and oriented and responds appropriately to questions. Well-appearing; well-nourished HEAD: Normocephalic EYES: Conjunctivae clear, PERRL ENT: normal nose; no rhinorrhea; moist mucous membranes; pharynx without lesions noted NECK: Supple, no meningismus, no LAD  CARD: RRR; S1 and S2 appreciated; no murmurs, no clicks, no rubs, no gallops RESP: Normal chest excursion without splinting or tachypnea; breath sounds clear and equal bilaterally; no wheezes, no rhonchi, no rales,  ABD/GI: Normal bowel sounds; non-distended; soft, non-tender, no rebound, no guarding BACK:  The back appears normal and is non-tender to palpation, there is no CVA tenderness EXT: 2+ radial and DP pulses bilaterally, tracer warm and well-perfused, compartments are soft and nontender, she has tenderness to palpation diffusely the right shoulder and pain with flexion shoulder past 45 but when she passively range her shoulder higher than that she is able to hold a cup with good strength, otherwise Normal ROM in all joints; otherwise extremities are non-tender to palpation; no edema; normal capillary refill; no cyanosis  SKIN: Normal color for age and race; warm NEURO: Moves all extremities equally, patient has decreased grip strength of the right hand otherwise she has 5/5 except strength in all of her other muscle groups bilaterally upper and lower extremities, 2+ deep tendon reflexes in bilateral upper and lower extremities, no clonus, patient has decreased sensation in her fourth and fifth digits of the right hand but no other sensory deficit on exam, normal gait PSYCH: The patient's mood and manner are appropriate. Grooming and personal hygiene are appropriate.  MEDICAL DECISION MAKING: Patient here with tingling around her  lips, no facial droop and right hand and arm numbness and pain is worse than normal. Suspect rotator cuff tendinitis versus carpal radiculopathy. I do not feel patient is having a stroke given she has bilateral lip tingling with no sign of facial droop or other sensory deficit in her face, she only has numbness in the right fourth and fifth digit with decreased grip strength otherwise her strength in her right upper extremity is normal with no pronator drift. Discussed with patient that I do not feel is a stroke I do not feel she needs a repeat MRI of her brain today. She is comfortable and pleased with this plan. We'll however order an MRI of her cervical spine. We'll obtain basic labs, urine and give pain medication. Anticipate that patient be able to be discharged home. Doubt epidural abscess, cervical myelopathy, discitis, transverse myelitis, intracranial  hemorrhage.  ED PROGRESS: Patient's labs are unremarkable. Her MRI of her neck shows right foraminal disc protrusion at C7-T1 with moderate to severe right foraminal stenosis which is likely the cause of her right hand numbness and weakness. She does have moderate spinal stenosis at C5-C6 but no signs of cervical myelopathy on exam. I feel that radiculopathy and possible rotator cuff tendinitis on the cause of her pain, numbness and weakness. I feel she is safe to be discharged home. She denies a history of diabetes. We'll sent home on Medrol Dosepak and with pain medication. Will give orthopedic outpatient followup information. I do not feel she needs acute evaluation for numbness and weakness given her symptoms have been present for several months. Patient verbalizes understanding is comfortable with plan.     EKG Interpretation  Date/Time:  Wednesday August 22 2013 17:54:38 EDT Ventricular Rate:  79 PR Interval:  138 QRS Duration: 130 QT Interval:  446 QTC Calculation: 511 R Axis:   28 Text Interpretation:  Sinus rhythm No significant change  since last tracing Confirmed by Rozalyn Osland,  DO, Johnnie Goynes [phone]) on 08/22/2013 6:00:01 PM        I personally performed the services described in this documentation, which was scribed in my presence. The recorded information has been reviewed and is accurate.     EKG Interpretation  Date/Time:  Wednesday August 22 2013 17:54:38 EDT Ventricular Rate:  79 PR Interval:  138 QRS Duration: 130 QT Interval:  446 QTC Calculation: 511 R Axis:   28 Text Interpretation:  Sinus rhythm No significant change since last tracing Confirmed by Evalynn Hankins,  DO, Brandice Busser [phone]) on 08/22/2013 6:00:01 PM        [place], DO 08/22/13 2101  [place], DO 08/22/13 2103

## 2013-08-22 NOTE — ED Notes (Signed)
Both hands are numb, started about 5 pm.  Sides of hand and lips feels like it is tingling.  Was released from [facility] last Monday with heat stroke.

## 2013-08-22 NOTE — ED Notes (Signed)
MD at bedside. 

## 2013-10-15 ENCOUNTER — Emergency Department (HOSPITAL_COMMUNITY)
Admission: EM | Admit: 2013-10-15 | Discharge: 2013-10-15 | Disposition: A | Discharge status: Home / Self Care | Payer: Medicare Other | Attending: Emergency Medicine | Admitting: Emergency Medicine

## 2013-10-15 ENCOUNTER — Encounter (HOSPITAL_COMMUNITY): Payer: Self-pay | Admitting: Emergency Medicine

## 2013-10-15 ENCOUNTER — Emergency Department (HOSPITAL_COMMUNITY): Payer: Medicare Other

## 2013-10-15 DIAGNOSIS — R1013 Epigastric pain: Secondary | ICD-10-CM | POA: Diagnosis not present

## 2013-10-15 DIAGNOSIS — Z8701 Personal history of pneumonia (recurrent): Secondary | ICD-10-CM | POA: Insufficient documentation

## 2013-10-15 DIAGNOSIS — K219 Gastro-esophageal reflux disease without esophagitis: Secondary | ICD-10-CM | POA: Insufficient documentation

## 2013-10-15 DIAGNOSIS — Z8619 Personal history of other infectious and parasitic diseases: Secondary | ICD-10-CM | POA: Diagnosis not present

## 2013-10-15 DIAGNOSIS — Z860100 Personal history of colon polyps, unspecified: Secondary | ICD-10-CM | POA: Insufficient documentation

## 2013-10-15 DIAGNOSIS — Z79899 Other long term (current) drug therapy: Secondary | ICD-10-CM | POA: Diagnosis not present

## 2013-10-15 DIAGNOSIS — R42 Dizziness and giddiness: Secondary | ICD-10-CM | POA: Insufficient documentation

## 2013-10-15 DIAGNOSIS — H9209 Otalgia, unspecified ear: Secondary | ICD-10-CM | POA: Diagnosis not present

## 2013-10-15 DIAGNOSIS — Z8739 Personal history of other diseases of the musculoskeletal system and connective tissue: Secondary | ICD-10-CM | POA: Diagnosis not present

## 2013-10-15 DIAGNOSIS — Z8679 Personal history of other diseases of the circulatory system: Secondary | ICD-10-CM | POA: Insufficient documentation

## 2013-10-15 DIAGNOSIS — H9201 Otalgia, right ear: Secondary | ICD-10-CM

## 2013-10-15 DIAGNOSIS — H5440 Blindness, one eye, unspecified eye: Secondary | ICD-10-CM | POA: Diagnosis not present

## 2013-10-15 LAB — BASIC METABOLIC PANEL
Anion gap: 11 (ref 5–15)
BUN: 10 mg/dL (ref 6–23)
CO2: 25 mEq/L (ref 19–32)
Calcium: 8.8 mg/dL (ref 8.4–10.5)
Chloride: 101 mEq/L (ref 96–112)
Creatinine, Ser: 0.79 mg/dL (ref 0.50–1.10)
GFR calc Af Amer: >90 mL/min (ref >90)
GFR, EST NON AFRICAN AMERICAN: 79 mL/min — AB (ref >90)
GLUCOSE: 105 mg/dL — AB (ref 70–99)
POTASSIUM: 4 meq/L (ref 3.7–5.3)
Sodium: 137 mEq/L (ref 137–147)

## 2013-10-15 LAB — CBC WITH DIFFERENTIAL/PLATELET
Basophils Absolute: 0 10*3/uL (ref 0.0–0.1)
Basophils Relative: 1 % (ref 0–1)
EOS ABS: 0.1 10*3/uL (ref 0.0–0.7)
EOS PCT: 2 % (ref 0–5)
HCT: 34.3 % — ABNORMAL LOW (ref 36.0–46.0)
HEMOGLOBIN: 11.2 g/dL — AB (ref 12.0–15.0)
LYMPHS ABS: 2.1 10*3/uL (ref 0.7–4.0)
LYMPHS PCT: 36 % (ref 12–46)
MCH: 30.3 pg (ref 26.0–34.0)
MCHC: 32.7 g/dL (ref 30.0–36.0)
MCV: 92.7 fL (ref 78.0–100.0)
MONOS PCT: 10 % (ref 3–12)
Monocytes Absolute: 0.6 10*3/uL (ref 0.1–1.0)
NEUTROS PCT: 51 % (ref 43–77)
Neutro Abs: 3.1 10*3/uL (ref 1.7–7.7)
PLATELETS: 134 10*3/uL — AB (ref 150–400)
RBC: 3.7 MIL/uL — AB (ref 3.87–5.11)
RDW: 13.2 % (ref 11.5–15.5)
WBC: 6 10*3/uL (ref 4.0–10.5)

## 2013-10-15 LAB — TROPONIN I: Troponin I: <0.3 ng/mL (ref <0.30)

## 2013-10-15 MED ORDER — MECLIZINE HCL 12.5 MG PO TABS
25.0000 mg | ORAL_TABLET | Freq: Once | ORAL | Status: AC
  Administered 2013-10-15: 25 mg via ORAL
  Filled 2013-10-15: qty 2

## 2013-10-15 MED ORDER — MECLIZINE HCL 25 MG PO TABS: 25.0000 mg | ORAL_TABLET | Freq: Two times a day (BID) | ORAL | Status: DC | PRN

## 2013-10-15 MED ORDER — PANTOPRAZOLE SODIUM 40 MG IV SOLR
40.0000 mg | Freq: Once | INTRAVENOUS | Status: DC
  Filled 2013-10-15 (×2): qty 40

## 2013-10-15 MED ORDER — OMEPRAZOLE 20 MG PO CPDR: 40.0000 mg | DELAYED_RELEASE_CAPSULE | Freq: Every day | ORAL | Status: AC

## 2013-10-15 MED ORDER — ACETAMINOPHEN 500 MG PO TABS
1000.0000 mg | ORAL_TABLET | Freq: Once | ORAL | Status: AC
  Administered 2013-10-15: 1000 mg via ORAL
  Filled 2013-10-15: qty 2

## 2013-10-15 MED ORDER — PANTOPRAZOLE SODIUM 40 MG PO TBEC
DELAYED_RELEASE_TABLET | ORAL | Status: AC
  Administered 2013-10-15: 40 mg
  Filled 2013-10-15: qty 1

## 2013-10-15 MED ORDER — PANTOPRAZOLE SODIUM 40 MG PO TBEC: 40.0000 mg | DELAYED_RELEASE_TABLET | Freq: Once | ORAL | Status: DC

## 2013-10-15 NOTE — ED Provider Notes (Signed)
CSN: [id number]     Arrival date & time 10/15/13  1225 History   First MD Initiated Contact with Patient 10/15/13 1314     Chief Complaint  Patient presents with  . Dizziness  . Otalgia     (Consider location/radiation/quality/duration/timing/severity/associated sxs/prior Treatment) HPI  This a 73 year old female who presents with dizziness and right ear pain. She states "I just didn't feel good at home." Also reports burning chest and epigastric pain that started when she was in the ambulance. On initial evaluation, patient was wanting to leave. She states her symptoms have resolved and "I feel stupid for wasting of everbody's time."  She is currently symptom-free.  I requested the patient stay for further evaluation. Initially she only agreed to screening EKG is within a degree to full accessment. Patient reports feeling lightheaded and developed right how to. Denies any fevers, rhinorrhea. States her chest pain was burning. She has had pain like this in the past and is supposed to be on a PPI. She has a known history of hiatal hernia. She denies any shortness of breath or diaphoresis.  Past Medical History  Diagnosis Date  . [facility] spotted fever   . Lyme disease   . Polio   . Measles   . Mumps   . GERD (gastroesophageal reflux disease)   . Sliding hiatal hernia   . Headache(784.0)   . Uveitis   . Blind right eye   . Colon polyp   . Renal disorder   . Pneumonia   . West Nile fever   . Calculus of gallbladder   . Diverticulosis of colon (without mention of hemorrhage)   . Osteoarthrosis   . Dermatophytosis of nail   . Synovial cyst of popliteal space   . Varicose veins   . Venous insufficiency   . Edema   . Carpal tunnel syndrome   . Disturbance of skin sensation   . Acute poliomyelitis   . Visual loss   . Gastric ulcer    Past Surgical History  Procedure Laterality Date  . Back surgery    . Tonsillectomy    . Adenoidectomy    . I&d extremity  03/14/2011   Procedure: IRRIGATION AND DEBRIDEMENT EXTREMITY;  Surgeon: Tennis Must, MD;  Location: [place];  Service: Orthopedics;  Laterality: Right;   Family History  Problem Relation Age of Onset  . Coronary artery disease Father   . Heart disease Father     before age 16  . Stroke Other    History  Substance Use Topics  . Smoking status: Never Smoker   . Smokeless tobacco: Never Used  . Alcohol Use: No   OB History   Grav Para Term Preterm Abortions TAB SAB Ect Mult Living   2 2 2       2      Review of Systems  Constitutional: Negative for fever.  HENT: Positive for ear pain. Negative for rhinorrhea.   Respiratory: Negative for cough, chest tightness and shortness of breath.   Cardiovascular: Positive for chest pain.  Gastrointestinal: Negative for nausea, vomiting and abdominal pain.  Genitourinary: Negative for dysuria.  Musculoskeletal: Negative for back pain.  Skin: Negative for wound.  Neurological: Positive for dizziness. Negative for weakness, numbness and headaches.  Psychiatric/Behavioral: Negative for confusion.  All other systems reviewed and are negative.     Allergies  Cefuroxime and Sulfa antibiotics  Home Medications   Prior to Admission medications   Medication Sig Start Date End Date  Taking? Authorizing Provider  Cyanocobalamin (VITAMIN B-12 PO) Take 1 tablet by mouth daily.    Historical Provider, MD  meclizine (ANTIVERT) 25 MG tablet Take 1 tablet (25 mg total) by mouth 2 (two) times daily as needed for dizziness. 10/15/13   Merryl Hacker, MD  omeprazole (PRILOSEC) 20 MG capsule Take 2 capsules (40 mg total) by mouth daily. 10/15/13   Merryl Hacker, MD  omeprazole (PRILOSEC) 40 MG capsule Take 40 mg by mouth daily.    Historical Provider, MD   BP 132/70  Pulse 84  Temp(Src) 97.7 F (36.5 C) (Oral)  Resp 18  Ht 5\' 4"  (1.626 m)  Wt 148 lb (67.132 kg)  BMI 25.39 kg/m2  SpO2 97% Physical Exam  Nursing note and vitals reviewed. Constitutional:  She is oriented to person, place, and time. No distress.  Elderly  HENT:  Head: Normocephalic and atraumatic.  Right Ear: External ear normal.  Left Ear: External ear normal.  Mouth/Throat: Oropharynx is clear and moist.  Eyes: Pupils are equal, round, and reactive to light.  No nystagmus noted  Cardiovascular: Normal rate, regular rhythm and normal heart sounds.   No murmur heard. Pulmonary/Chest: Effort normal and breath sounds normal. No respiratory distress. She has no wheezes.  Abdominal: Soft. Bowel sounds are normal. There is tenderness. There is no rebound and no guarding.  Musculoskeletal: She exhibits no edema.  Neurological: She is alert and oriented to person, place, and time.  No dysmetria to finger-nose-finger, normal gait  Skin: Skin is warm and dry.    ED Course  Procedures (including critical care time) Labs Review Labs Reviewed  CBC WITH DIFFERENTIAL - Abnormal; Notable for the following:    RBC 3.70 (*)    Hemoglobin 11.2 (*)    HCT 34.3 (*)    Platelets 134 (*)    All other components within normal limits  BASIC METABOLIC PANEL - Abnormal; Notable for the following:    Glucose, Bld 105 (*)    GFR calc non Af Amer 79 (*)    All other components within normal limits  TROPONIN I    Imaging Review Dg Chest 2 View  10/15/2013   CLINICAL DATA:  Dizziness  EXAM: CHEST  2 VIEW  COMPARISON:  03/06/2012  FINDINGS: The heart size and mediastinal contours are within normal limits. Both lungs are clear. The visualized skeletal structures are unremarkable.  IMPRESSION: No active cardiopulmonary disease.   Electronically Signed   By: Kathreen Devoid   On: 10/15/2013 14:22     EKG Interpretation   Date/Time:  Monday October 15 2013 13:26:18 EDT Ventricular Rate:  67 PR Interval:  120 QRS Duration: 84 QT Interval:  418 QTC Calculation: 441 R Axis:   10 Text Interpretation:  Normal sinus rhythm with sinus arrhythmia Normal ECG  Confirmed by Braley Luckenbaugh  MD, Loma Sousa  ([id number]) on 10/15/2013 1:31:08 PM      MDM   Final diagnoses:  Otalgia, right  Dizziness  Epigastric abdominal pain    Patient presents with multiple complaints. She initially declined evaluation. I requested she signed out against medical device. She does agree to stay for further evaluation.  She is complaining of ear pain but exam is normal. She's also complaining of dizziness. No evidence of orthostasis. She is neurologically intact. Patient was given meclizine with improvement of her symptoms. Patient also complaining of chest pain. Reports that this pain is similar to when her hiatal hernia acts up. EKG is reassuring and initial cardiac  workup is reassuring. Given this is similar to her prior cardiac, will give PPI. On recheck, patient is requesting discharge. She remains neurologically intact and without complaint. Will discharge with PCP followup.  After history, exam, and medical workup I feel the patient has been appropriately medically screened and is safe for discharge home. Pertinent diagnoses were discussed with the patient. Patient was given return precautions.    Merryl Hacker, MD 10/16/13 [phone]

## 2013-10-15 NOTE — ED Notes (Signed)
Pateint states that she was at home and started feeling dizzy and having right ear pain. States she called EMS and on the way to the hospital started having chest pain also.

## 2013-10-15 NOTE — Discharge Instructions (Signed)
Dizziness °Dizziness is a common problem. It is a feeling of unsteadiness or light-headedness. You may feel like you are about to faint. Dizziness can lead to injury if you stumble or fall. A person of any age group can suffer from dizziness, but dizziness is more common in older adults. °CAUSES  °Dizziness can be caused by many different things, including: °· Middle ear problems. °· Standing for too long. °· Infections. °· An allergic reaction. °· Aging. °· An emotional response to something, such as the sight of blood. °· Side effects of medicines. °· Tiredness. °· Problems with circulation or blood pressure. °· Excessive use of alcohol or medicines, or illegal drug use. °· Breathing too fast (hyperventilation). °· An irregular heart rhythm (arrhythmia). °· A low red blood cell count (anemia). °· Pregnancy. °· Vomiting, diarrhea, fever, or other illnesses that cause body fluid loss (dehydration). °· Diseases or conditions such as Parkinson's disease, high blood pressure (hypertension), diabetes, and thyroid problems. °· Exposure to extreme heat. °DIAGNOSIS  °Your health care provider will ask about your symptoms, perform a physical exam, and perform an electrocardiogram (ECG) to record the electrical activity of your heart. Your health care provider may also perform other heart or blood tests to determine the cause of your dizziness. These may include: °· Transthoracic echocardiogram (TTE). During echocardiography, sound waves are used to evaluate how blood flows through your heart. °· Transesophageal echocardiogram (TEE). °· Cardiac monitoring. This allows your health care provider to monitor your heart rate and rhythm in real time. °· Holter monitor. This is a portable device that records your heartbeat and can help diagnose heart arrhythmias. It allows your health care provider to track your heart activity for several days if needed. °· Stress tests by exercise or by giving medicine that makes the heart beat  faster. °TREATMENT  °Treatment of dizziness depends on the cause of your symptoms and can vary greatly. °HOME CARE INSTRUCTIONS  °· Drink enough fluids to keep your urine clear or pale yellow. This is especially important in very hot weather. In older adults, it is also important in cold weather. °· Take your medicine exactly as directed if your dizziness is caused by medicines. When taking blood pressure medicines, it is especially important to get up slowly. °¨ Rise slowly from chairs and steady yourself until you feel okay. °¨ In the morning, first sit up on the side of the bed. When you feel okay, stand slowly while holding onto something until you know your balance is fine. °· Move your legs often if you need to stand in one place for a long time. Tighten and relax your muscles in your legs while standing. °· Have someone stay with you for 1-2 days if dizziness continues to be a problem. Do this until you feel you are well enough to stay alone. Have the person call your health care provider if he or she notices changes in you that are concerning. °· Do not drive or use heavy machinery if you feel dizzy. °· Do not drink alcohol. °SEEK IMMEDIATE MEDICAL CARE IF:  °· Your dizziness or light-headedness gets worse. °· You feel nauseous or vomit. °· You have problems talking, walking, or using your arms, hands, or legs. °· You feel weak. °· You are not thinking clearly or you have trouble forming sentences. It may take a friend or family member to notice this. °· You have chest pain, abdominal pain, shortness of breath, or sweating. °· Your vision changes. °· You notice   any bleeding.  You have side effects from medicine that seems to be getting worse rather than better. MAKE SURE YOU:   Understand these instructions.  Will watch your condition.  Will get help right away if you are not doing well or get worse. Document Released: 08/04/2000 Document Revised: 02/13/2013 Document Reviewed: 08/28/2010 [facility]  Patient Information 2015 [place], [place]. This information is not intended to replace advice given to you by your health care provider. Make sure you discuss any questions you have with your health care provider. Hiatal Hernia A hiatal hernia occurs when part of your stomach slides above the muscle that separates your abdomen from your chest (diaphragm). You can be born with a hiatal hernia (congenital), or it may develop over time. In almost all cases of hiatal hernia, only the top part of the stomach pushes through.  Many people have a hiatal hernia with no symptoms. The larger the hernia, the more likely that you will have symptoms. In some cases, a hiatal hernia allows stomach acid to flow back into the tube that carries food from your mouth to your stomach (esophagus). This may cause heartburn symptoms. Severe heartburn symptoms may mean you have developed a condition called gastroesophageal reflux disease (GERD).  CAUSES  Hiatal hernias are caused by a weakness in the opening (hiatus) where your esophagus passes through your diaphragm to attach to the upper part of your stomach. You may be born with a weakness in your hiatus, or a weakness can develop. RISK FACTORS Older age is a major risk factor for a hiatal hernia. Anything that increases pressure on your diaphragm can also increase your risk of a hiatal hernia. This includes:  Pregnancy.  Excess weight.  Frequent constipation. SIGNS AND SYMPTOMS  People with a hiatal hernia often have no symptoms. If symptoms develop, they are almost always caused by GERD. They may include:  Heartburn.  Belching.  Indigestion.  Trouble swallowing.  Coughing or wheezing.  Sore throat.  Hoarseness.  Chest pain. DIAGNOSIS  A hiatal hernia is sometimes found during an exam for another problem. Your health care provider may suspect a hiatal hernia if you have symptoms of GERD. Tests may be done to diagnose GERD. These may include:  X-rays of  your stomach or chest.  An upper gastrointestinal (GI) series. This is an X-ray exam of your GI tract involving the use of a chalky liquid that you swallow. The liquid shows up clearly on the X-ray.  Endoscopy. This is a procedure to look into your stomach using a thin, flexible tube that has a tiny camera and light on the end of it. TREATMENT  If you have no symptoms, you may not need treatment. If you have symptoms, treatment may include:  Dietary and lifestyle changes to help reduce GERD symptoms.  Medicines. These may include:  Over-the-counter antacids.  Medicines that make your stomach empty more quickly.  Medicines that block the production of stomach acid (H2 blockers).  Stronger medicines to reduce stomach acid (proton pump inhibitors).  You may need surgery to repair the hernia if other treatments are not helping. HOME CARE INSTRUCTIONS   Take all medicines as directed by your health care provider.  Quit smoking, if you smoke.  Try to achieve and maintain a healthy body weight.  Eat frequent small meals instead of three large meals a day. This keeps your stomach from getting too full.  Eat slowly.  Do not lie down right after eating.  Do noteat 1-2 hours  before bed.   Do not drink beverages with caffeine. These include cola, coffee, cocoa, and tea.  Do not drink alcohol.  Avoid foods that can make symptoms of GERD worse. These may include:  Fatty foods.  Citrus fruits.  Other foods and drinks that contain acid.  Avoid putting pressure on your belly. Anything that puts pressure on your belly increases the amount of acid that may be pushed up into your esophagus.   Avoid bending over, especially after eating.  Raise the head of your bed by putting blocks under the legs. This keeps your head and esophagus higher than your stomach.  Do not wear tight clothing around your chest or stomach.  Try not to strain when having a bowel movement, when  urinating, or when lifting heavy objects. SEEK MEDICAL CARE IF:  Your symptoms are not controlled with medicines or lifestyle changes.  You are having trouble swallowing.  You have coughing or wheezing that will not go away. SEEK IMMEDIATE MEDICAL CARE IF:  Your pain is getting worse.  Your pain spreads to your arms, neck, jaw, teeth, or back.  You have shortness of breath.  You sweat for no reason.  You feel sick to your stomach (nauseous) or vomit.  You vomit blood.  You have bright red blood in your stools.  You have black, tarry stools.  Document Released: 05/01/2003 Document Revised: 06/25/2013 Document Reviewed: 01/26/2013 [facility] Patient Information 2015 [place], [place]. This information is not intended to replace advice given to you by your health care provider. Make sure you discuss any questions you have with your health care provider.

## 2013-12-24 ENCOUNTER — Encounter (HOSPITAL_COMMUNITY): Payer: Self-pay | Admitting: Emergency Medicine

## 2013-12-26 ENCOUNTER — Other Ambulatory Visit: Payer: Self-pay

## 2013-12-26 DIAGNOSIS — M7989 Other specified soft tissue disorders: Secondary | ICD-10-CM

## 2014-01-02 ENCOUNTER — Other Ambulatory Visit (HOSPITAL_COMMUNITY): Payer: Self-pay | Admitting: Family Medicine

## 2014-01-02 ENCOUNTER — Ambulatory Visit (HOSPITAL_COMMUNITY)
Admission: RE | Admit: 2014-01-02 | Discharge: 2014-01-02 | Disposition: A | Discharge status: Home / Self Care | Payer: Medicare Other | Source: Ambulatory Visit | Attending: Family Medicine | Admitting: Family Medicine

## 2014-01-02 DIAGNOSIS — M542 Cervicalgia: Secondary | ICD-10-CM

## 2014-01-22 ENCOUNTER — Encounter: Payer: Medicare Other | Admitting: Adult Health

## 2014-01-22 ENCOUNTER — Ambulatory Visit (INDEPENDENT_AMBULATORY_CARE_PROVIDER_SITE_OTHER): Payer: Medicare Other | Admitting: Adult Health

## 2014-01-22 ENCOUNTER — Encounter: Payer: Self-pay | Admitting: Adult Health

## 2014-01-22 VITALS — BP 170/80 | Ht 64.0 in | Wt 158.0 lb

## 2014-01-22 DIAGNOSIS — N939 Abnormal uterine and vaginal bleeding, unspecified: Secondary | ICD-10-CM | POA: Insufficient documentation

## 2014-01-22 HISTORY — DX: Abnormal uterine and vaginal bleeding, unspecified: N93.9

## 2014-01-22 MED ORDER — HYDROCODONE-ACETAMINOPHEN 7.5-325 MG PO TABS: ORAL_TABLET | ORAL | Status: DC

## 2014-01-22 MED ORDER — ALPRAZOLAM 0.5 MG PO TABS: ORAL_TABLET | ORAL | Status: DC

## 2014-01-22 NOTE — Progress Notes (Signed)
Subjective:     Patient ID: Tanya Ellis, female   DOB: December 03, 1940, 73 y.o.   MRN: [id number]  HPI Tanya Ellis is a 73 year old white female, widowed, referred by Dr Tanya Ellis for vaginal bleeding and ?cyst.She had CT of abd/pelvis 03/21/12 for pain.She has vaginal bleeding for several months and passes ?Pus at times.CT showed ?nabothian cyst at cervix.Not sure when had last pap.  Review of Systems See HPI Reviewed past medical,surgical, social and family history. Reviewed medications and allergies.     Objective:   Physical Exam BP 170/80 mmHg  Ht 5\' 4"  (1.626 m)  Wt 158 lb (71.668 kg)  BMI 27.11 kg/m2   Skin warm and dry.Pelvic: external genitalia is normal in appearance for age, no lesions, vagina: atrophic with pink to red blood, slight odor, cervix:not seen, pt holds legs together,Dr Tanya Ellis in saw ?cyst/polyp, could not see due to pt not tolerating exam, uterus: normal size, shape and contour, non tender, no masses felt, adnexa: no masses or tenderness noted. Will need to get [place] and give pain meds prior to exam with Dr Tanya Ellis after [place].  Assessment:     Vaginal bleeding    Plan:     Rx xanax 0.5 mg #5 bring to office to take 1 prior to [place] Rx norco 7.5/325 mg #5 bring to office to take 1 prior to [place]   Return in 1 week for pelvic US and see Dr Tanya Ellis

## 2014-01-22 NOTE — Patient Instructions (Signed)
Return in 1 week for [place] and see Dr Elonda Husky Postmenopausal Bleeding Postmenopausal bleeding is any bleeding a woman has after she has entered into menopause. Menopause is the end of a woman's fertile years. After menopause, a woman no longer ovulates or has menstrual periods.  Postmenopausal bleeding can be caused by various things. Any type of postmenopausal bleeding, even if it appears to be a typical menstrual period, is concerning. This should be evaluated by your health care provider. Any treatment will depend on the cause of the bleeding. HOME CARE INSTRUCTIONS Monitor your condition for any changes. The following actions may help to alleviate any discomfort you are experiencing:  Avoid the use of tampons and douches as directed by your health care provider.  Change your pads frequently.  Get regular pelvic exams and Pap tests.  Keep all follow-up appointments for diagnostic tests as directed by your health care provider. SEEK MEDICAL CARE IF:   Your bleeding lasts more than 1 week.  You have abdominal pain.  You have bleeding with sexual intercourse. SEEK IMMEDIATE MEDICAL CARE IF:   You have a fever, chills, headache, dizziness, muscle aches, and bleeding.  You have severe pain with bleeding.  You are passing blood clots.  You have bleeding and need more than 1 pad an hour.  You feel faint. MAKE SURE YOU:  Understand these instructions.  Will watch your condition.  Will get help right away if you are not doing well or get worse. Document Released: 05/19/2005 Document Revised: 11/29/2012 Document Reviewed: 09/07/2012 [facility] Patient Information 2015 [place], [place]. This information is not intended to replace advice given to you by your health care provider. Make sure you discuss any questions you have with your health care provider.

## 2014-01-28 ENCOUNTER — Ambulatory Visit (HOSPITAL_COMMUNITY): Payer: Medicare Other

## 2014-01-28 ENCOUNTER — Ambulatory Visit: Payer: Medicare Other | Admitting: Surgery

## 2014-01-30 ENCOUNTER — Ambulatory Visit (INDEPENDENT_AMBULATORY_CARE_PROVIDER_SITE_OTHER): Payer: Medicare Other

## 2014-01-30 ENCOUNTER — Ambulatory Visit (INDEPENDENT_AMBULATORY_CARE_PROVIDER_SITE_OTHER): Payer: Medicare Other | Admitting: Obstetrics & Gynecology

## 2014-01-30 ENCOUNTER — Other Ambulatory Visit: Payer: Self-pay | Admitting: Obstetrics & Gynecology

## 2014-01-30 ENCOUNTER — Telehealth: Payer: Self-pay | Admitting: *Deleted

## 2014-01-30 ENCOUNTER — Other Ambulatory Visit: Payer: Self-pay | Admitting: Adult Health

## 2014-01-30 ENCOUNTER — Encounter: Payer: Self-pay | Admitting: Obstetrics & Gynecology

## 2014-01-30 VITALS — BP 144/84 | Wt 162.0 lb

## 2014-01-30 DIAGNOSIS — C541 Malignant neoplasm of endometrium: Secondary | ICD-10-CM

## 2014-01-30 DIAGNOSIS — N939 Abnormal uterine and vaginal bleeding, unspecified: Secondary | ICD-10-CM

## 2014-01-30 DIAGNOSIS — R198 Other specified symptoms and signs involving the digestive system and abdomen: Secondary | ICD-10-CM

## 2014-01-30 MED ORDER — HYDROCODONE-ACETAMINOPHEN 7.5-325 MG PO TABS: ORAL_TABLET | ORAL | Status: DC

## 2014-01-30 NOTE — Telephone Encounter (Signed)
Trip from [facility] called stating that he filled a Rx for hydrocodone yesterday for #5 and just received another Rx today for # 30, he is wanting to make sure that this is correct and the dosing instructions on it.  Talked with Dr. Elonda Husky and he does want her to get #30 today and is to take 1 tab every 4 - 6 hours as needed for pain.  Informed pharmacist.

## 2014-01-30 NOTE — Progress Notes (Signed)
Patient ID: Tanya Ellis, female   DOB: Jul 01, 1940, 73 y.o.   MRN: [id number]  Impression: Endometrial cancer, presumptive, with large central endometrial mass Unable to adequately examine in office due to severe atrophy and patient inability to tolerate  Plan: I talked with Dr. Alycia Rossetti of the [facility] oncologist and she has agreed to see the patient next week 9 Wednesday, December 16 at 1:15.  She understands my concern about definitive diagnosis and she agrees that it would be more reasonable for her to do the initial evaluation decide on the manner in which to proceed from there  In the meantime ordering a CT scan of the abdomen and pelvis to see if that will help shed any more light on the clinical situation  I talked with Ms. Lana and her daughter at length regarding the possible diagnosis of endometrial cancer with or without involvement of the lower uterine segment and cervix and upper vagina due to a lack of visualization and her exam previously. They understand the uncertainty at this point but agreed to proceed for further evaluation and treatment  US Transvaginal Non-ob  01/30/2014   GYNECOLOGIC SONOGRAM   Tanya Ellis is a 73 y.o. [id number] for a pelvic sonogram for rectal pressure  and vaginal bleeding.  Uterus                     Unable to differentiate uterine borders  transabdominally or transvaginally (pt unable to tolerate vaginal probe  insertion completely)                               Appears to be a solid appearing mass noted  within the uterus   Right ovary             3.1 x 2.0 x 1.9 cm,   Left ovary               Unable to positively identify ovarian tissue   No free fluid noted   Technician Comments:  Limited exam due to pt intolerance of vaginal ultrasound, Appears to be a  solid appearing mass within the uterus, no free fluid noted within the  pelvis   Lazarus Gowda 01/30/2014 1:47 PM   Clinical Impression and recommendations:  I have reviewed the sonogram results above,  combined with the patient's  current clinical course, below are my impressions and any appropriate  recommendations for management based on the sonographic findings.  Concern for  endometrial cancer with large cental uterine mass  Discussed with Dr Alycia Rossetti and she will see patient and evaluate next week   Tanya Ellis 01/30/2014 2:43 PM     Previous HPI Aveah is a 73 year old white female, widowed, referred by Dr Karie Kirks for vaginal bleeding and ?cyst.She had CT of abd/pelvis 03/21/12 for pain.She has vaginal bleeding for several months and passes ?Pus at times.CT showed ?nabothian cyst at cervix.Not sure when had last pap    Medication List       This list is accurate as of: 01/30/14  3:25 PM.  Always use your most recent med list.               ALPRAZolam 0.5 MG tablet  Commonly known as:  XANAX  Bring to office to take before exam     HYDROcodone-acetaminophen 7.5-325 MG per tablet  Commonly known as:  NORCO  Bring to office to take prior to  procedure     omeprazole 20 MG capsule  Commonly known as:  PRILOSEC  Take 2 capsules (40 mg total) by mouth daily.     VITAMIN B-12 PO  Take 1 tablet by mouth daily.       Past Medical History  Diagnosis Date  . [facility] spotted fever   . Lyme disease   . Polio   . Measles   . Mumps   . GERD (gastroesophageal reflux disease)   . Sliding hiatal hernia   . Headache(784.0)   . Uveitis   . Blind right eye   . Colon polyp   . Renal disorder   . Pneumonia   . West Nile fever   . Calculus of gallbladder   . Diverticulosis of colon (without mention of hemorrhage)   . Osteoarthrosis   . Dermatophytosis of nail   . Synovial cyst of popliteal space   . Varicose veins   . Venous insufficiency   . Edema   . Carpal tunnel syndrome   . Disturbance of skin sensation   . Acute poliomyelitis   . Visual loss   . Gastric ulcer   . Vaginal bleeding 01/22/2014    Past Surgical History  Procedure Laterality Date  . Back surgery     . Tonsillectomy    . Adenoidectomy    . I&d extremity  03/14/2011    Procedure: IRRIGATION AND DEBRIDEMENT EXTREMITY;  Surgeon: Tennis Must, MD;  Location: [facility];  Service: Orthopedics;  Laterality: Right;    OB History    Gravida Para Term Preterm AB TAB SAB Ectopic Multiple Living   2 2 2       2       Allergies  Allergen Reactions  . Cefuroxime   . Sulfa Antibiotics Rash    History   Social History  . Marital Status: Widowed    Spouse Name: N/A    Number of Children: N/A  . Years of Education: N/A   Social History Main Topics  . Smoking status: Former Smoker    Types: Cigarettes  . Smokeless tobacco: Never Used  . Alcohol Use: No  . Drug Use: No  . Sexual Activity: No   Other Topics Concern  . None   Social History Narrative    Family History  Problem Relation Age of Onset  . Coronary artery disease Father   . Heart disease Father     before age 80  . Diabetes Mother   . Deafness Daughter   . Other Daughter     grand gresis  . Stroke Son   . Stroke Maternal Grandfather   . Stroke Paternal Grandmother     heat stroke  . Stroke Paternal Grandfather

## 2014-01-31 ENCOUNTER — Telehealth: Payer: Self-pay | Admitting: *Deleted

## 2014-01-31 ENCOUNTER — Telehealth: Payer: Self-pay | Admitting: Obstetrics & Gynecology

## 2014-01-31 LAB — CREATININE, SERUM: Creat: 0.63 mg/dL (ref 0.50–1.10)

## 2014-01-31 MED ORDER — ALPRAZOLAM 0.5 MG PO TABS: ORAL_TABLET | ORAL | Status: DC

## 2014-01-31 NOTE — Telephone Encounter (Signed)
Pt informed her appt is next Wednesday, 01/22/2014 @1 :15, at [facility] with Dr. Paul Half, Oncologist. Pt verbalized understanding.

## 2014-01-31 NOTE — Telephone Encounter (Signed)
Pt daughter, Estill Batten came into the office today requesting a RX for Xanax for her Mom, states her mom is up at all times of the night.

## 2014-02-01 ENCOUNTER — Encounter (HOSPITAL_COMMUNITY): Payer: Self-pay

## 2014-02-01 ENCOUNTER — Ambulatory Visit (HOSPITAL_COMMUNITY)
Admission: RE | Admit: 2014-02-01 | Discharge: 2014-02-01 | Disposition: A | Discharge status: Home / Self Care | Payer: Medicare Other | Source: Ambulatory Visit | Attending: Obstetrics & Gynecology | Admitting: Obstetrics & Gynecology

## 2014-02-01 DIAGNOSIS — C779 Secondary and unspecified malignant neoplasm of lymph node, unspecified: Secondary | ICD-10-CM | POA: Diagnosis not present

## 2014-02-01 DIAGNOSIS — K449 Diaphragmatic hernia without obstruction or gangrene: Secondary | ICD-10-CM | POA: Diagnosis not present

## 2014-02-01 DIAGNOSIS — C7951 Secondary malignant neoplasm of bone: Secondary | ICD-10-CM | POA: Diagnosis not present

## 2014-02-01 DIAGNOSIS — N938 Other specified abnormal uterine and vaginal bleeding: Secondary | ICD-10-CM | POA: Insufficient documentation

## 2014-02-01 DIAGNOSIS — K8020 Calculus of gallbladder without cholecystitis without obstruction: Secondary | ICD-10-CM | POA: Insufficient documentation

## 2014-02-01 DIAGNOSIS — R1909 Other intra-abdominal and pelvic swelling, mass and lump: Secondary | ICD-10-CM | POA: Insufficient documentation

## 2014-02-01 DIAGNOSIS — C541 Malignant neoplasm of endometrium: Secondary | ICD-10-CM

## 2014-02-01 MED ORDER — IOHEXOL 300 MG/ML  SOLN
100.0000 mL | Freq: Once | INTRAMUSCULAR | Status: AC | PRN
  Administered 2014-02-01: 100 mL via INTRAVENOUS

## 2014-02-06 ENCOUNTER — Encounter: Payer: Self-pay | Admitting: Gynecologic Oncology

## 2014-02-06 ENCOUNTER — Ambulatory Visit: Payer: Medicare Other | Attending: Gynecologic Oncology | Admitting: Gynecologic Oncology

## 2014-02-06 DIAGNOSIS — M47896 Other spondylosis, lumbar region: Secondary | ICD-10-CM | POA: Insufficient documentation

## 2014-02-06 DIAGNOSIS — M5124 Other intervertebral disc displacement, thoracic region: Secondary | ICD-10-CM | POA: Diagnosis not present

## 2014-02-06 DIAGNOSIS — N939 Abnormal uterine and vaginal bleeding, unspecified: Secondary | ICD-10-CM | POA: Insufficient documentation

## 2014-02-06 DIAGNOSIS — Z800 Family history of malignant neoplasm of digestive organs: Secondary | ICD-10-CM | POA: Diagnosis not present

## 2014-02-06 DIAGNOSIS — Z87891 Personal history of nicotine dependence: Secondary | ICD-10-CM | POA: Insufficient documentation

## 2014-02-06 DIAGNOSIS — M47894 Other spondylosis, thoracic region: Secondary | ICD-10-CM | POA: Diagnosis not present

## 2014-02-06 DIAGNOSIS — K7689 Other specified diseases of liver: Secondary | ICD-10-CM | POA: Insufficient documentation

## 2014-02-06 DIAGNOSIS — N3946 Mixed incontinence: Secondary | ICD-10-CM | POA: Diagnosis not present

## 2014-02-06 DIAGNOSIS — N858 Other specified noninflammatory disorders of uterus: Secondary | ICD-10-CM

## 2014-02-06 DIAGNOSIS — D649 Anemia, unspecified: Secondary | ICD-10-CM | POA: Insufficient documentation

## 2014-02-06 DIAGNOSIS — R52 Pain, unspecified: Secondary | ICD-10-CM | POA: Diagnosis not present

## 2014-02-06 DIAGNOSIS — I700 Atherosclerosis of aorta: Secondary | ICD-10-CM | POA: Diagnosis not present

## 2014-02-06 DIAGNOSIS — M5136 Other intervertebral disc degeneration, lumbar region: Secondary | ICD-10-CM | POA: Insufficient documentation

## 2014-02-06 DIAGNOSIS — K449 Diaphragmatic hernia without obstruction or gangrene: Secondary | ICD-10-CM | POA: Diagnosis not present

## 2014-02-06 DIAGNOSIS — N281 Cyst of kidney, acquired: Secondary | ICD-10-CM | POA: Insufficient documentation

## 2014-02-06 DIAGNOSIS — K868 Other specified diseases of pancreas: Secondary | ICD-10-CM | POA: Diagnosis not present

## 2014-02-06 DIAGNOSIS — C7951 Secondary malignant neoplasm of bone: Secondary | ICD-10-CM | POA: Diagnosis not present

## 2014-02-06 DIAGNOSIS — K8020 Calculus of gallbladder without cholecystitis without obstruction: Secondary | ICD-10-CM | POA: Insufficient documentation

## 2014-02-06 DIAGNOSIS — C779 Secondary and unspecified malignant neoplasm of lymph node, unspecified: Secondary | ICD-10-CM | POA: Insufficient documentation

## 2014-02-06 DIAGNOSIS — Z808 Family history of malignant neoplasm of other organs or systems: Secondary | ICD-10-CM

## 2014-02-06 DIAGNOSIS — C55 Malignant neoplasm of uterus, part unspecified: Secondary | ICD-10-CM

## 2014-02-06 DIAGNOSIS — M4806 Spinal stenosis, lumbar region: Secondary | ICD-10-CM | POA: Insufficient documentation

## 2014-02-06 DIAGNOSIS — R1900 Intra-abdominal and pelvic swelling, mass and lump, unspecified site: Secondary | ICD-10-CM | POA: Diagnosis present

## 2014-02-06 NOTE — Progress Notes (Signed)
Consult Note: Gyn-Onc  Tanya Ellis 73 y.o. female  CC:  Chief Complaint  Patient presents with  . Uterine Mass    New patient    HPI: Patient is seen today in consultation at the request of Dr. Elonda Husky.  Patient comes to clinic today for evaluation of the uterine mass. She comes accompanied by her friend, her sister, and her daughter. The patient took an anti-anxiolytic prior to coming to clinic and is not a very good historian today. Much of the history is provided by her sister. Expert at the patient's 30 year old gravida 2 para 2 who states that for the past year she's been having vaginal bleeding and pain. She states that she was seen by provider who she cannot remember the name of had a Pap smear that was negative and feels that she was not taken seriously. Within the medical record it does appear that she had a CT scan of the abdomen and pelvis in January 2014 that was negative. She had a visit with the referring physician in May of that year and no objective findings were identified. She recently represented again with increasing spotting and clots. She states that she has developed anemia and is currently on iron medication. She had an ultrasound performed and the referring physician's office that revealed a large central endometrial mass.  An ultrasound was performed on December 9 that revealed a solid appearing mass noted within the uterus. Left ovary was unable to be positively identified. The right ovary measured 3.1 x 2 x 1.9 cm. Due to the inability to examine her in the office a CT scan of the abdomen and pelvis was performed on December 11. The results have not been yet discussed with the patient.  CT ABDOMEN AND PELVIS WITH CONTRAST COMPARISON: Pelvic ultrasound 01/30/2014 from [facility]. CT abdomen and pelvis 03/21/2012.  FINDINGS: Large mass in the endometrium, encompassing the uterine fundus and body, low uterine segment, with extension to the uterine cervix. The mass  is difficult to measure in its entirety, but is likely on the order of 6 x 6 x 8 cm. The mass appears to invade the myometrium as suggested on the pelvic ultrasound. Involvement of the right fallopian tube is also suspected, as the right adnexum is more prominent than on the prior CT. No ascites.  Enlarged bilateral internal iliac lymph nodes, on the right measuring approximately 1.8 x 1.6 cm and on the left approximately 2.1 x 1.3 cm. Enlarged bilateral obturator nodes, on the right measuring approximately 2.2 x 1.4 cm and on the left approximately 2.3 x 1.7 cm. Enlarged bilateral deep inguinal lymph nodes, on the right measuring approximately 1.6 x 2.5 cm and on the left approximately 1.2 x 2.6 cm. Aortocaval retroperitoneal lymph node measures approximately 1.6 x 0.9 cm. Enlarged left periaortic lymph node measures approximately 1.4 x 1.0 cm. All of these nodes have increased significantly in size since the prior CT.  Stable approximate 1 cm cyst in the right lobe of the liver near the dome; no significant focal hepatic parenchymal abnormality. Normal-appearing spleen, adrenal glands, and right kidney. Small exophytic simple cyst arising from the posterior left kidney, unchanged. No new or significant findings in the left kidney. Stable mild pancreatic atrophy. Cholelithiasis, with partially calcified gallstones measuring approximately 2.4 and 2.2 cm, unchanged, without evidence of acute cholecystitis. No biliary ductal dilation. Moderate to severe aortoiliac atherosclerosis without aneurysm.  Moderate-sized hiatal hernia, unchanged. Stomach otherwise unremarkable. Normal small bowel. Large stool burden throughout  the normal appearing colon. Normal appendix in the right upper pelvis. No ascites.  Urinary bladder decompressed and unremarkable. Numerous pelvic phleboliths.  Bone window images demonstrate new mixed lytic and sclerotic metastases to the right superior and inferior pubic rami, with a  lytic lesion in the inferior pubic ramus causing marked cortical thinning. Diffuse degenerative disc disease, spondylosis and facet degenerative changes throughout the lumbar spine, lower thoracic spondylosis, chronic T11-T12 disc herniation with calcification in the posterior annular fibers, and moderate to severe multifactorial spinal stenosis at every level from L1-2 through L4-5.  Visualized lung bases clear. Heart size upper normal.  IMPRESSION: 1. Large endometrial mass with myometrial invasion, extension into the uterine cervix, and possible extension into the right fallopian tube. Best measurements are given above. 2. Metastatic lymphadenopathy in both sides of the pelvis and in the retroperitoneum as detailed above. 3. 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 The inferior pubic ramus is at risk for pathologic fracture. 4. Cholelithiasis without CT evidence of acute cholecystitis. 5. Stable moderate size hiatal hernia. 6. Chronic osseous findings as detailed above.  The patient states that for the past few weeks she's having rectal pain that's "all the time. She's complain of some pain in her lower abdomen. She denies any bright red blood per rectum or other change in her bowel or bladder habits. She is having some incontinence. Incontinence is gotten worse over the past 2 months. She described primarily stress incontinence and urge incontinence. She states her weight is stable to slightly increased. Past surgical history is notable for back surgery spinal surgery. I had an extensive review of the family history with the patient's sister.  Review of Systems  Constitutional: Denies fever. Skin: No rash  Cardiovascular: No chest pain, shortness of breath, or edema  Pulmonary: No cough  Gastro Intestinal: Reporting intermittent lower abdominal soreness.  No nausea, vomiting, constipation, or diarrhea reported. No bright red blood per  rectum or change in bowel movement.  Genitourinary: No frequency, urgency, or dysuria.  + vaginal bleeding and discharge.  Neurologic: No weakness, numbness, or change in gait.  Psychology: Please see plan   Current Meds:  Outpatient Encounter Prescriptions as of 02/06/2014  Medication Sig  . Cyanocobalamin (VITAMIN B-12 PO) Take 1 tablet by mouth daily.  Marland Kitchen HYDROcodone-acetaminophen (NORCO) 7.5-325 MG per tablet Bring to office to take prior to procedure  . omeprazole (PRILOSEC) 20 MG capsule Take 2 capsules (40 mg total) by mouth daily.  Marland Kitchen ALPRAZolam (XANAX) 0.5 MG tablet Take TID prn anxiety (Patient not taking: Reported on 02/06/2014)  . [DISCONTINUED] HYDROcodone-acetaminophen (NORCO/VICODIN) 5-325 MG per tablet     Allergy:  Allergies  Allergen Reactions  . Cefuroxime   . Sulfa Antibiotics Rash    Social Hx:   History   Social History  . Marital Status: Widowed    Spouse Name: N/A    Number of Children: N/A  . Years of Education: N/A   Occupational History  . Not on file.   Social History Main Topics  . Smoking status: Former Smoker    Types: Cigarettes  . Smokeless tobacco: Never Used  . Alcohol Use: No  . Drug Use: No  . Sexual Activity: No   Other Topics Concern  . Not on file   Social History Narrative    Past Surgical Hx:  Past Surgical History  Procedure Laterality Date  . Back surgery    . Tonsillectomy    . Adenoidectomy    .  I&d extremity  03/14/2011    Procedure: IRRIGATION AND DEBRIDEMENT EXTREMITY;  Surgeon: Tennis Must, MD;  Location: [place];  Service: Orthopedics;  Laterality: Right;    Past Medical Hx:  Past Medical History  Diagnosis Date  . [facility] spotted fever   . Lyme disease   . Polio   . Measles   . Mumps   . GERD (gastroesophageal reflux disease)   . Sliding hiatal hernia   . Headache(784.0)   . Uveitis   . Blind right eye   . Colon polyp   . Renal disorder   . Pneumonia   . West Nile fever   . Calculus of  gallbladder   . Diverticulosis of colon (without mention of hemorrhage)   . Osteoarthrosis   . Dermatophytosis of nail   . Synovial cyst of popliteal space   . Varicose veins   . Venous insufficiency   . Edema   . Carpal tunnel syndrome   . Disturbance of skin sensation   . Acute poliomyelitis   . Visual loss   . Gastric ulcer   . Vaginal bleeding 01/22/2014  . Colon cancer     sister in 42s  . Colon cancer     niece in 76s  . Colon cancer     mother in 15s    Oncology Hx:   No history exists.    Family Hx:  Family History  Problem Relation Age of Onset  . Coronary artery disease Father   . Heart disease Father     before age 84  . Diabetes Mother   . Cancer Mother   . Deafness Daughter   . Other Daughter     grand gresis  . Stroke Son   . Stroke Maternal Grandfather   . Stroke Paternal Grandmother     heat stroke  . Stroke Paternal Grandfather   . Cancer Sister   . Cancer Maternal Aunt   . Cancer Maternal Uncle   . Cancer Other   . Cancer Maternal Aunt     Vitals:  Blood pressure 151/87, pulse 74, temperature 97.6 F (36.4 C), temperature source Oral, resp. rate 18, height $RemoveBe'5\' 4"'gWNAOrYpn$  (1.626 m), weight 165 lb 3.2 oz (74.934 kg).  Physical Exam: Poorly Ill-appearing female in no acute distress.  Neck: Supple, no lymphadenopathy, no thyromegaly.  Lungs: Clear to auscultation bilaterally.  Cardiovascular: Regular rate and rhythm.  Abdomen: Soft, nontender, nondistended. No rebound or guarding.  Groins: No lymphadenopathy.  Extremities: Chronic venous stasis changes bilaterally. Right greater than left. Negative Homans sign on the right. No palpable cord. No calf tenderness. Poorly kept Toenails. Several sores on the hands.  Pelvic external genitalia is within normal limits. Attempt was made a speculum examination which the patient would not tolerate. Unit digital exam reveals some nodularity along the anterior vagina. The cervix itself is palpably normal. The  os was closed. The patient would not tolerate any additional exam.  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 On imaging she is at risk for pathologic fracture of the pubic symphysis. I had a lengthy discussion with the patient her sister and her friend Opal Sidles). Greater than 1 hour face-to-face time was spent. We need to get a biopsy for diagnosis and the most reasonable way of obtaining a biopsy would be with vascular radiology. A consult has been placed for them to biopsy what they think is most appropriate. We can call the patient at [phone] however she does not  have any answering machine. We can call her friend Opal Sidles at [phone]. Opal Sidles does have any answering machine. We can also call her sister on her cell phone number at [phone] for at her home number at [phone]. We will go ahead and schedule her to be seen by radiation oncology should we need to proceed with more emergent radiation to stabilized the bone. She will also need Zometa infusion. We'll schedule her to see Dr. Marko Plume. Hopefully we'll be able to obtain a biopsy later this week and have an answer with regards to pathology by next week.  I spoke in very general terms about the treatment being primarily chemotherapy. They wondered if surgery would be indicated and I discussed with them that it would not as she has widely metastatic disease.  The patient has a very complicated social history. She lives at home with two adult children that have various states of disability as well as 24 cats. In looking through her medical record, it does appear that Adult Protective Services has been called out to the house by the Hospital social workers. I initially offered social work assistance and  The patient declined but ultimately she was seen by the cancer center social workers and support was provided. The patient did make some off handed remarks regarding "jumping off a bridge". I had a  lengthy and frank discussion with her regarding suicidal ideation. She states she does not have any plans to kill herself and that she has to take care of her children and her "babies". Her babies involve the 24 cats that she takes care of and she states she would never do anything hurt herself because she needs to take care of them. The patient does express some hope that we will be able to provide her quality of life and give her some quality time and I discussed with her that that may be the case.However,  without knowing the histology I  cannot answer question at  this time, but that her cancer is not curable.  The patient's daughter came asking for the patient to be provided with additional pain medications. On December 9 she was provided 30 of narco 7.5 mg. When the patient and the daughter stepped out of the room the patient's pain medication needs were discussed with her sister and her friend. They both state that her pain medicines are given and then they "disappear". The patient then also corroborated this that she was given a prescription for pain medication to take at home and then there were no longer available. This was discussed by Joylene John with the patient. Therefore, we will not provide her with pain medication at this point as in her current social situation there is significant probability of diversion of narcotics.  She knows to call us if she has any questions. However her we will try to schedule her biopsy as soon as possible and we will already go ahead and make referrals for our colleagues in the cancer center that we'll be able to deliver chemotherapy as well as radiation in a timely fashion. We also discussed the need for genetic testing. The patient's niece in her 63s had colon cancer in the patient's sister in her 90s had colon cancer and the melanoma. Think they are most likely at risk for microsatellite instability. The patient's sister and her daughter will go ahead and get  tested. That will be informative for the rest of the family.  Nancy Marus A., MD 02/06/2014, 3:36 PM

## 2014-02-08 ENCOUNTER — Telehealth: Payer: Self-pay | Admitting: Gynecologic Oncology

## 2014-02-08 ENCOUNTER — Other Ambulatory Visit: Payer: Self-pay | Admitting: Radiology

## 2014-02-08 NOTE — Telephone Encounter (Signed)
Returned call to patient's sister.  She wanted to give further information on the patient.  Stating the patient had polio when she was 49 and she has also been treated for lymes disease.  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  She states the patient is taking large amounts of antibiotics that she orders from [place].  Informed of date and time for biopsy.  Advised to call for any further questions or concerns.

## 2014-02-11 ENCOUNTER — Other Ambulatory Visit: Payer: Self-pay | Admitting: Radiology

## 2014-02-12 ENCOUNTER — Other Ambulatory Visit: Payer: Self-pay | Admitting: Gynecologic Oncology

## 2014-02-12 DIAGNOSIS — N858 Other specified noninflammatory disorders of uterus: Secondary | ICD-10-CM

## 2014-02-13 ENCOUNTER — Ambulatory Visit (HOSPITAL_COMMUNITY)
Admission: RE | Admit: 2014-02-13 | Discharge: 2014-02-13 | Disposition: A | Discharge status: Home / Self Care | Payer: Medicare Other | Source: Ambulatory Visit | Attending: Gynecologic Oncology | Admitting: Gynecologic Oncology

## 2014-02-13 ENCOUNTER — Encounter (HOSPITAL_COMMUNITY): Payer: Self-pay

## 2014-02-13 ENCOUNTER — Telehealth: Payer: Self-pay | Admitting: Oncology

## 2014-02-13 DIAGNOSIS — N858 Other specified noninflammatory disorders of uterus: Secondary | ICD-10-CM

## 2014-02-13 DIAGNOSIS — R590 Localized enlarged lymph nodes: Secondary | ICD-10-CM | POA: Insufficient documentation

## 2014-02-13 DIAGNOSIS — N9489 Other specified conditions associated with female genital organs and menstrual cycle: Secondary | ICD-10-CM | POA: Insufficient documentation

## 2014-02-13 HISTORY — PX: INGUINAL LYMPH NODE BIOPSY: SHX5865

## 2014-02-13 LAB — CBC
HCT: 33.1 % — ABNORMAL LOW (ref 36.0–46.0)
HEMOGLOBIN: 10.6 g/dL — AB (ref 12.0–15.0)
MCH: 28.8 pg (ref 26.0–34.0)
MCHC: 32 g/dL (ref 30.0–36.0)
MCV: 89.9 fL (ref 78.0–100.0)
Platelets: 149 10*3/uL — ABNORMAL LOW (ref 150–400)
RBC: 3.68 MIL/uL — AB (ref 3.87–5.11)
RDW: 13.4 % (ref 11.5–15.5)
WBC: 6.7 10*3/uL (ref 4.0–10.5)

## 2014-02-13 LAB — PROTIME-INR
INR: 1.03 (ref 0.00–1.49)
Prothrombin Time: 13.7 seconds (ref 11.6–15.2)

## 2014-02-13 LAB — APTT: aPTT: 29 seconds (ref 24–37)

## 2014-02-13 MED ORDER — SODIUM CHLORIDE 0.9 % IV SOLN
INTRAVENOUS | Status: DC
  Administered 2014-02-13: 12:00:00 via INTRAVENOUS

## 2014-02-13 MED ORDER — MIDAZOLAM HCL 2 MG/2ML IJ SOLN
INTRAMUSCULAR | Status: AC | PRN
  Administered 2014-02-13: 1 mg via INTRAVENOUS

## 2014-02-13 MED ORDER — FENTANYL CITRATE 0.05 MG/ML IJ SOLN
INTRAMUSCULAR | Status: AC | PRN
  Administered 2014-02-13: 50 ug via INTRAVENOUS

## 2014-02-13 MED ORDER — MIDAZOLAM HCL 2 MG/2ML IJ SOLN
INTRAMUSCULAR | Status: AC
  Filled 2014-02-13: qty 2

## 2014-02-13 MED ORDER — FENTANYL CITRATE 0.05 MG/ML IJ SOLN
INTRAMUSCULAR | Status: AC
  Filled 2014-02-13: qty 2

## 2014-02-13 NOTE — Procedures (Signed)
Successful rt inguinal adenopathy 18g core bx No comp Stable Path pending

## 2014-02-13 NOTE — Telephone Encounter (Signed)
CALLED PATIENT TO SCHEDULE NP APPT PER CAREGIVER PATIENT WILL BE D/C

## 2014-02-13 NOTE — H&P (Signed)
Chief Complaint: "I am here for a biopsy."  Referring Physician(s): Cross,Melissa D  History of Present Illness: Tanya Ellis with an endometrial mass and lymphadenopathy seen by Dr. Alycia Rossetti with Gyn-Onc on 02/06/14 and scheduled for biopsy today. The patient has had a Ellis&P performed within the last 30 days, all history, medications, and exam have been reviewed. The patient denies any interval changes since the Ellis&P.   Past Medical History  Diagnosis Date  . [facility] spotted fever   . Lyme disease   . Polio   . Measles   . Mumps   . GERD (gastroesophageal reflux disease)   . Sliding hiatal hernia   . Headache(784.0)   . Uveitis   . Blind right eye   . Colon polyp   . Renal disorder   . Pneumonia   . West Nile fever   . Calculus of gallbladder   . Diverticulosis of colon (without mention of hemorrhage)   . Osteoarthrosis   . Dermatophytosis of nail   . Synovial cyst of popliteal space   . Varicose veins   . Venous insufficiency   . Edema   . Carpal tunnel syndrome   . Disturbance of skin sensation   . Acute poliomyelitis   . Visual loss   . Gastric ulcer   . Vaginal bleeding 01/22/2014  . Colon cancer     sister in 53s  . Colon cancer     niece in 26s  . Colon cancer     mother in 70s    Past Surgical History  Procedure Laterality Date  . Back surgery    . Tonsillectomy    . Adenoidectomy    . I&d extremity  03/14/2011    Procedure: IRRIGATION AND DEBRIDEMENT EXTREMITY;  Surgeon: Tennis Must, MD;  Location: [place];  Service: Orthopedics;  Laterality: Right;    Allergies: Cefuroxime and Sulfa antibiotics  Medications: Prior to Admission medications   Medication Sig Start Date End Date Taking? Authorizing Provider  ALPRAZolam Duanne Moron) 0.5 MG tablet Take 0.5 mg by mouth 3 (three) times daily as needed for anxiety.   Yes Historical Provider, MD  amoxicillin (AMOXIL) 500 MG capsule Take 500 mg by mouth 2 (two) times daily.   Yes  Historical Provider, MD  HYDROcodone-acetaminophen ([place]) 7.5-325 MG per tablet Bring to office to take prior to procedure Patient taking differently: Take 1 tablet by mouth 2 (two) times daily as needed for moderate pain.  01/30/14  Yes Florian Buff, MD  omeprazole (PRILOSEC) 20 MG capsule Take 2 capsules (40 mg total) by mouth daily. 10/15/13  Yes Merryl Hacker, MD  ALPRAZolam Duanne Moron) 0.5 MG tablet Take TID prn anxiety Patient not taking: Reported on 02/06/2014 01/31/14   Florian Buff, MD  traMADol (ULTRAM) 50 MG tablet Take 50-100 mg by mouth every 6 (six) hours as needed for moderate pain.    Historical Provider, MD    Family History  Problem Relation Age of Onset  . Coronary artery disease Father   . Heart disease Father     before age 88  . Diabetes Mother   . Cancer Mother   . Deafness Daughter   . Other Daughter     grand gresis  . Stroke Son   . Stroke Maternal Grandfather   . Stroke Paternal Grandmother     heat stroke  . Stroke Paternal Grandfather   . Cancer Sister   . Cancer Maternal Aunt   .  Cancer Maternal Uncle   . Cancer Other   . Cancer Maternal Aunt     History   Social History  . Marital Status: Widowed    Spouse Name: N/A    Number of Children: N/A  . Years of Education: N/A   Social History Main Topics  . Smoking status: Former Smoker    Types: Cigarettes  . Smokeless tobacco: Never Used  . Alcohol Use: No  . Drug Use: No  . Sexual Activity: No   Other Topics Concern  . None   Social History Narrative   Review of Systems: A 12 point ROS discussed and pertinent positives are indicated in the HPI above.  All other systems are negative.  Review of Systems  Vital Signs: BP 147/78 mmHg  Pulse 95  Temp(Src) 97.9 F (36.6 C) (Oral)  Resp 16  Ht 5\' 4"  (1.626 m)  Wt 165 lb (74.844 kg)  BMI 28.31 kg/m2  SpO2 100%  Physical Exam  Constitutional: She is oriented to person, place, and time. No distress.  HENT:  Head: Normocephalic  and atraumatic.  Cardiovascular: Normal rate and regular rhythm.  Exam reveals no gallop and no friction rub.   No murmur heard. Pulmonary/Chest: Effort normal.  Diminished BS bilaterally  Neurological: She is alert and oriented to person, place, and time.  Skin: She is not diaphoretic.    Imaging: US Transvaginal Non-ob  01/30/2014   GYNECOLOGIC SONOGRAM   Tanya Ellis is a 73 y.o. [id number] for a pelvic sonogram for rectal pressure  and vaginal bleeding.  Uterus                     Unable to differentiate uterine borders  transabdominally or transvaginally (pt unable to tolerate vaginal probe  insertion completely)                               Appears to be a solid appearing mass noted  within the uterus   Right ovary             3.1 x 2.0 x 1.9 cm,   Left ovary               Unable to positively identify ovarian tissue   No free fluid noted   Technician Comments:  Limited exam due to pt intolerance of vaginal ultrasound, Appears to be a  solid appearing mass within the uterus, no free fluid noted within the  pelvis   Tanya Ellis 01/30/2014 1:47 PM   Clinical Impression and recommendations:  I have reviewed the sonogram results above, combined with the patient's  current clinical course, below are my impressions and any appropriate  recommendations for management based on the sonographic findings.  Concern for  endometrial cancer with large cental uterine mass  Discussed with Dr Alycia Rossetti and she will see patient and evaluate next week   Tanya Ellis 01/30/2014 2:43 PM    Ct Abdomen Pelvis W Contrast  02/02/2014   CLINICAL DATA:  Six month history of dysfunctional uterine bleeding, with heavy bleeding beginning approximately 2 months ago. Pelvic ultrasound in Dr. Brynda Greathouse office questioned an endometrial mass.  EXAM: CT ABDOMEN AND PELVIS WITH CONTRAST  TECHNIQUE: Multidetector CT imaging of the abdomen and pelvis was performed using the standard protocol following bolus administration of intravenous  contrast.  CONTRAST:  138mL OMNIPAQUE IOHEXOL 300 MG/ML IV. Oral contrast was also administered.  COMPARISON:  Pelvic  ultrasound 01/30/2014 from [facility]. CT abdomen and pelvis 03/21/2012.  FINDINGS: Large mass in the endometrium, encompassing the uterine fundus and body, low uterine segment, with extension to the uterine cervix. The mass is difficult to measure in its entirety, but is likely on the order of 6 x 6 x 8 cm. The mass appears to invade the myometrium as suggested on the pelvic ultrasound. Involvement of the right fallopian tube is also suspected, as the right adnexum is more prominent than on the prior CT. No ascites.  Enlarged bilateral internal iliac lymph nodes, on the right measuring approximately 1.8 x 1.6 cm and on the left approximately 2.1 x 1.3 cm. Enlarged bilateral obturator nodes, on the right measuring approximately 2.2 x 1.4 cm and on the left approximately 2.3 x 1.7 cm. Enlarged bilateral deep inguinal lymph nodes, on the right measuring approximately 1.6 x 2.5 cm and on the left approximately 1.2 x 2.6 cm. Aortocaval retroperitoneal lymph node measures approximately 1.6 x 0.9 cm. Enlarged left periaortic lymph node measures approximately 1.4 x 1.0 cm. All of these nodes have increased significantly in size since the prior CT.  Stable approximate 1 cm cyst in the right lobe of the liver near the dome; no significant focal hepatic parenchymal abnormality. Normal-appearing spleen, adrenal glands, and right kidney. Small exophytic simple cyst arising from the posterior left kidney, unchanged. No new or significant findings in the left kidney. Stable mild pancreatic atrophy. Cholelithiasis, with partially calcified gallstones measuring approximately 2.4 and 2.2 cm, unchanged, without evidence of acute cholecystitis. No biliary ductal dilation. Moderate to severe aortoiliac atherosclerosis without aneurysm.  Moderate-sized hiatal hernia, unchanged. Stomach otherwise unremarkable.  Normal small bowel. Large stool burden throughout the normal appearing colon. Normal appendix in the right upper pelvis. No ascites.  Urinary bladder decompressed and unremarkable. Numerous pelvic phleboliths.  Bone window images demonstrate new mixed lytic and sclerotic metastases to the right superior and inferior pubic rami, with a lytic lesion in the inferior pubic ramus causing marked cortical thinning. Diffuse degenerative disc disease, spondylosis and facet degenerative changes throughout the lumbar spine, lower thoracic spondylosis, chronic T11-T12 disc herniation with calcification in the posterior annular fibers, and moderate to severe multifactorial spinal stenosis at every level from L1-2 through L4-5.  Visualized lung bases clear.  Heart size upper normal.  IMPRESSION: 1. Large endometrial mass with myometrial invasion, extension into the uterine cervix, and possible extension into the right fallopian tube. Best measurements are given above. 2. Metastatic lymphadenopathy in both sides of the pelvis and in the retroperitoneum as detailed above. 3. 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 The inferior pubic ramus is at risk for pathologic fracture. 4. Cholelithiasis without CT evidence of acute cholecystitis. 5. Stable moderate size hiatal hernia. 6. Chronic osseous findings as detailed above.   Electronically Signed   By: Evangeline Dakin M.D.   On: 02/02/2014 16:59    Labs:  CBC:  Recent Labs  08/12/13 1455 08/22/13 2011 10/15/13 1337 02/13/14 1140  WBC 6.4 5.7 6.0 6.7  HGB 11.6* 12.4 11.2* 10.6*  HCT 34.6* 37.6 34.3* 33.1*  PLT 128* 134* 134* 149*    COAGS:  Recent Labs  08/12/13 1530  INR 1.02    BMP:  Recent Labs  08/12/13 1455 08/22/13 2011 10/15/13 1337 01/30/14 1459  NA 135* 140 137  --   K 4.0 4.0 4.0  --   CL 98 102 101  --   CO2  25 26 25   --   GLUCOSE 116* 119* 105*  --   BUN 11 9 10   --   CALCIUM 9.2  9.4 8.8  --   CREATININE 0.65 0.69 0.79 0.63  GFRNONAA 85* 83* 79*  --   GFRAA >90 >90 >90  --     LIVER FUNCTION TESTS:  Recent Labs  08/12/13 1455  BILITOT 0.6  AST 17  ALT 11  ALKPHOS 75  PROT 6.2  ALBUMIN 3.7    Assessment and Plan: Endometrial mass Lymphadenopathy Scheduled today for image guided inguinal lymph node biopsy with moderate sedation Patient has been NPO, no blood thinners taken, labs reviewed Risks and Benefits discussed with the patient. All of the patient's questions were answered, patient is agreeable to proceed. Consent signed and in chart.     SignedHedy Jacob 02/13/2014, 12:10 PM

## 2014-02-13 NOTE — Progress Notes (Signed)
Patient has been drinking tumeric tea.  patient burned her finger tips cooking. Has blisters x 2 .  Wrapped w telfa and tegaderm after arrival

## 2014-02-13 NOTE — Discharge Instructions (Signed)
Conscious Sedation, Adult, Care After Refer to this sheet in the next few weeks. These instructions provide you with information on caring for yourself after your procedure. Your health care provider may also give you more specific instructions. Your treatment has been planned according to current medical practices, but problems sometimes occur. Call your health care provider if you have any problems or questions after your procedure. WHAT TO EXPECT AFTER THE PROCEDURE  After your procedure:  You may feel sleepy, clumsy, and have poor balance for several hours.  Vomiting may occur if you eat too soon after the procedure. HOME CARE INSTRUCTIONS  Do not participate in any activities where you could become injured for at least 24 hours. Do not:  Drive.  Swim.  Ride a bicycle.  Operate heavy machinery.  Otting.  Use power tools.  Climb ladders.  Work from a high place.  Do not make important decisions or sign legal documents until you are improved.  If you vomit, drink water, juice, or soup when you can drink without vomiting. Make sure you have little or no nausea before eating solid foods.  Only take over-the-counter or prescription medicines for pain, discomfort, or fever as directed by your health care provider.  Make sure you and your family fully understand everything about the medicines given to you, including what side effects may occur.  You should not drink alcohol, take sleeping pills, or take medicines that cause drowsiness for at least 24 hours.  If you smoke, do not smoke without supervision.  If you are feeling better, you may resume normal activities 24 hours after you were sedated.  Keep all appointments with your health care provider. SEEK MEDICAL CARE IF:  Your skin is pale or bluish in color.  You continue to feel nauseous or vomit.  Your pain is getting worse and is not helped by medicine.  You have bleeding or swelling.  You are still sleepy or  feeling clumsy after 24 hours. SEEK IMMEDIATE MEDICAL CARE IF:  You develop a rash.  You have difficulty breathing.  You develop any type of allergic problem.  You have a fever. MAKE SURE YOU:  Understand these instructions.  Will watch your condition.  Will get help right away if you are not doing well or get worse. Document Released: 11/29/2012 Document Reviewed: 11/29/2012 [facility] Patient Information 2015 [place], [place]. This information is not intended to replace advice given to you by your health care provider. Make sure you discuss any questions you have with your health care provider. Needle Biopsy Care After These instructions give you information on caring for yourself after your procedure. Your doctor may also give you more specific instructions. Call your doctor if you have any problems or questions after your procedure. HOME CARE  Rest for 4 hours after your biopsy, except for getting up to go to the bathroom or as told.  Keep the places where the needles were put in clean and dry.  Do not put powder or lotion on the sites.  Do not shower until 24 hours after the test. Remove all bandages (dressings) before showering.  Remove all bandages at least once every day. Gently clean the sites with soap and water. Keep putting a new bandage on until the skin is closed. Finding out the results of your test Ask your doctor when your test results will be ready. Make sure you follow up and get the test results. GET HELP RIGHT AWAY IF:   You have shortness of  breath or trouble breathing.  You have pain or cramping in your belly (abdomen).  You feel sick to your stomach (nauseous) or throw up (vomit).  Any of the places where the needles were put in:  Are puffy (swollen) or red.  Are sore or hot to the touch.  Are draining yellowish-white fluid (pus).  Are bleeding after 10 minutes of pressing down on the site. Have someone keep pressing on any place that is  bleeding until you see a doctor.  You have any unusual pain that will not stop.  You have a fever. If you go to the emergency room, tell the nurse that you had a biopsy. Take this paper with you to show the nurse. MAKE SURE YOU:   Understand these instructions.  Will watch your condition.  Will get help right away if you are not doing well or get worse. Document Released: 01/22/2008 Document Revised: 05/03/2011 Document Reviewed: 01/22/2008 [facility] Patient Information 2015 [place], [place]. This information is not intended to replace advice given to you by your health care provider. Make sure you discuss any questions you have with your health care provider.

## 2014-02-13 NOTE — Progress Notes (Signed)
GYN Location of Tumor / Histology: Metastatic uterine mass to inguinal lymph nodes  Arnoldo Hooker presented  months ago with symptoms of: vaginal bleeding several months, patient reported bleeding x 2 years, states it began as yellow discharge, denies vaginal bleeding today  Biopsies of:02/13/14 Right inguinal node  Past/Anticipated interventions by Gyn/Onc surgery, if [id number] not reccommended.Dr. Alycia Rossetti to see Patient 02/06/14  Past/Anticipated interventions by medical oncology, if [id number] made to see Dr.Livesay for consideration of chemotherapy  Weight changes, if any: fluctuates 5 lbs up and down  Bowel/Bladder complaints, if any: Yes , Constipation, taking stool softeners, denies urinary issues  Nausea/Vomiting, if any: no Pain issues, if any:  Pain in right hip, 5/10 today, Hydrocodone prn, takes 1-3 tabs daily  SAFETY ISSUES:  Prior radiation? No  Pacemaker/ICD? No  Possible current pregnancy? No  Is the patient on methotrexate? No  Current Complaints / other details: Widowed, vegetarian,  Blind right eye, menarche age 3.5, G19P2, Lives with 2 adult children, , former smoker cigarettes, no alcohol or illicit drugs Mother colon cancer in her 26's, niece colon cancer in her 80's, sister colon cancer in her 44's,   Allergies: Cefuroxime, Sulfa Antibiotics

## 2014-02-15 ENCOUNTER — Encounter (HOSPITAL_COMMUNITY): Payer: Self-pay | Admitting: Emergency Medicine

## 2014-02-15 ENCOUNTER — Emergency Department (HOSPITAL_COMMUNITY)
Admission: EM | Admit: 2014-02-15 | Discharge: 2014-02-15 | Disposition: A | Discharge status: Home / Self Care | Payer: Medicare Other | Attending: Emergency Medicine | Admitting: Emergency Medicine

## 2014-02-15 DIAGNOSIS — R112 Nausea with vomiting, unspecified: Secondary | ICD-10-CM | POA: Diagnosis not present

## 2014-02-15 DIAGNOSIS — C7982 Secondary malignant neoplasm of genital organs: Secondary | ICD-10-CM | POA: Insufficient documentation

## 2014-02-15 DIAGNOSIS — Z8742 Personal history of other diseases of the female genital tract: Secondary | ICD-10-CM | POA: Diagnosis not present

## 2014-02-15 DIAGNOSIS — Z8701 Personal history of pneumonia (recurrent): Secondary | ICD-10-CM | POA: Insufficient documentation

## 2014-02-15 DIAGNOSIS — Z79899 Other long term (current) drug therapy: Secondary | ICD-10-CM | POA: Diagnosis not present

## 2014-02-15 DIAGNOSIS — Z86010 Personal history of colonic polyps: Secondary | ICD-10-CM | POA: Insufficient documentation

## 2014-02-15 DIAGNOSIS — Z8619 Personal history of other infectious and parasitic diseases: Secondary | ICD-10-CM | POA: Diagnosis not present

## 2014-02-15 DIAGNOSIS — Z87891 Personal history of nicotine dependence: Secondary | ICD-10-CM | POA: Insufficient documentation

## 2014-02-15 DIAGNOSIS — K219 Gastro-esophageal reflux disease without esophagitis: Secondary | ICD-10-CM | POA: Diagnosis not present

## 2014-02-15 DIAGNOSIS — M7981 Nontraumatic hematoma of soft tissue: Secondary | ICD-10-CM | POA: Diagnosis not present

## 2014-02-15 DIAGNOSIS — C541 Malignant neoplasm of endometrium: Secondary | ICD-10-CM

## 2014-02-15 DIAGNOSIS — Z792 Long term (current) use of antibiotics: Secondary | ICD-10-CM | POA: Diagnosis not present

## 2014-02-15 DIAGNOSIS — M1990 Unspecified osteoarthritis, unspecified site: Secondary | ICD-10-CM | POA: Insufficient documentation

## 2014-02-15 DIAGNOSIS — Z87448 Personal history of other diseases of urinary system: Secondary | ICD-10-CM | POA: Insufficient documentation

## 2014-02-15 DIAGNOSIS — N390 Urinary tract infection, site not specified: Secondary | ICD-10-CM | POA: Insufficient documentation

## 2014-02-15 DIAGNOSIS — R109 Unspecified abdominal pain: Secondary | ICD-10-CM | POA: Diagnosis present

## 2014-02-15 DIAGNOSIS — H5441 Blindness, right eye, normal vision left eye: Secondary | ICD-10-CM | POA: Insufficient documentation

## 2014-02-15 LAB — COMPREHENSIVE METABOLIC PANEL
ALK PHOS: 97 U/L (ref 39–117)
ALT: 12 U/L (ref 0–35)
ANION GAP: 6 (ref 5–15)
AST: 21 U/L (ref 0–37)
Albumin: 4.1 g/dL (ref 3.5–5.2)
BUN: 8 mg/dL (ref 6–23)
CO2: 26 mmol/L (ref 19–32)
Calcium: 9.3 mg/dL (ref 8.4–10.5)
Chloride: 103 mEq/L (ref 96–112)
Creatinine, Ser: 0.63 mg/dL (ref 0.50–1.10)
GFR calc non Af Amer: 86 mL/min — ABNORMAL LOW (ref >90)
GLUCOSE: 122 mg/dL — AB (ref 70–99)
POTASSIUM: 3.8 mmol/L (ref 3.5–5.1)
Sodium: 135 mmol/L (ref 135–145)
TOTAL PROTEIN: 7.2 g/dL (ref 6.0–8.3)
Total Bilirubin: 0.8 mg/dL (ref 0.3–1.2)

## 2014-02-15 LAB — URINE MICROSCOPIC-ADD ON

## 2014-02-15 LAB — URINALYSIS, ROUTINE W REFLEX MICROSCOPIC
BILIRUBIN URINE: NEGATIVE
Glucose, UA: NEGATIVE mg/dL
Hgb urine dipstick: NEGATIVE
KETONES UR: 15 mg/dL — AB
NITRITE: NEGATIVE
Protein, ur: NEGATIVE mg/dL
SPECIFIC GRAVITY, URINE: 1.01 (ref 1.005–1.030)
Urobilinogen, UA: 1 mg/dL (ref 0.0–1.0)
pH: 6 (ref 5.0–8.0)

## 2014-02-15 LAB — CBC WITH DIFFERENTIAL/PLATELET
Basophils Absolute: 0.1 10*3/uL (ref 0.0–0.1)
Basophils Relative: 1 % (ref 0–1)
EOS ABS: 0.1 10*3/uL (ref 0.0–0.7)
EOS PCT: 1 % (ref 0–5)
HCT: 34.5 % — ABNORMAL LOW (ref 36.0–46.0)
HEMOGLOBIN: 11 g/dL — AB (ref 12.0–15.0)
Lymphocytes Relative: 32 % (ref 12–46)
Lymphs Abs: 1.8 10*3/uL (ref 0.7–4.0)
MCH: 28.6 pg (ref 26.0–34.0)
MCHC: 31.9 g/dL (ref 30.0–36.0)
MCV: 89.8 fL (ref 78.0–100.0)
MONO ABS: 0.5 10*3/uL (ref 0.1–1.0)
MONOS PCT: 8 % (ref 3–12)
NEUTROS PCT: 58 % (ref 43–77)
Neutro Abs: 3.3 10*3/uL (ref 1.7–7.7)
Platelets: 165 10*3/uL (ref 150–400)
RBC: 3.84 MIL/uL — ABNORMAL LOW (ref 3.87–5.11)
RDW: 13.6 % (ref 11.5–15.5)
WBC: 5.8 10*3/uL (ref 4.0–10.5)

## 2014-02-15 MED ORDER — CIPROFLOXACIN HCL 500 MG PO TABS: 500.0000 mg | ORAL_TABLET | Freq: Two times a day (BID) | ORAL | Status: DC

## 2014-02-15 MED ORDER — ONDANSETRON HCL 4 MG/2ML IJ SOLN
4.0000 mg | Freq: Once | INTRAMUSCULAR | Status: AC
  Administered 2014-02-15: 4 mg via INTRAVENOUS
  Filled 2014-02-15: qty 2

## 2014-02-15 MED ORDER — MORPHINE SULFATE 4 MG/ML IJ SOLN
4.0000 mg | Freq: Once | INTRAMUSCULAR | Status: AC
  Administered 2014-02-15: 4 mg via INTRAVENOUS
  Filled 2014-02-15: qty 1

## 2014-02-15 MED ORDER — HYDROCODONE-ACETAMINOPHEN 7.5-325 MG PO TABS: ORAL_TABLET | ORAL | Status: DC

## 2014-02-15 NOTE — Discharge Instructions (Signed)

## 2014-02-15 NOTE — ED Notes (Signed)
PT c/o lower abdominal pain with n/v x1 day. PT states recently diagnosed with cancer but no tx started. PT states she had abdominal biopsy x2 days and also c/o urinary burning.

## 2014-02-15 NOTE — ED Provider Notes (Signed)
CSN: [id number]     Arrival date & time 02/15/14  1444 History  This chart was scribed for [employer]. Alvino Chapel, MD by Edison Simon, ED Scribe. This patient was seen in room APA15/APA15 and the patient's care was started at 3:17 PM.    Chief Complaint  Patient presents with  . Abdominal Pain   The history is provided by the patient and a friend. No language interpreter was used.    HPI Comments: Tanya Ellis is a 73 y.o. female who presents to the Emergency Department complaining of abdominal pain with onset 3 weeks ago. She states her current pain is unlike prior abdominal pain and ovarian cyst pain. She describes it like she "sat on a corn sob and it is pushing in my rectum." She reports associated nausea, vomiting, dysuria which she attributes to raw skin, and fever yesterday that resolved. She states she has been having abdominal pain for some time; she saw an OBGYN 2 years ago who diagnosed her with ovarian cyst. Per friend, she has complained of pain, bleeding, and discharge for 9-10 months. She recently was sent by her PCP, Dr. Karie Kirks, her for further studies that revealed evidence of cancer. She had abdominal biopsy 2 days ago but does not know the results yet. She denies constipation or difficulty urinating.  Past Medical History  Diagnosis Date  . [facility] spotted fever   . Lyme disease   . Polio   . Measles   . Mumps   . GERD (gastroesophageal reflux disease)   . Sliding hiatal hernia   . Headache(784.0)   . Uveitis   . Blind right eye   . Colon polyp   . Renal disorder   . Pneumonia   . West Nile fever   . Calculus of gallbladder   . Diverticulosis of colon (without mention of hemorrhage)   . Osteoarthrosis   . Dermatophytosis of nail   . Synovial cyst of popliteal space   . Varicose veins   . Venous insufficiency   . Edema   . Carpal tunnel syndrome   . Disturbance of skin sensation   . Acute poliomyelitis   . Visual loss   . Gastric ulcer   . Vaginal  bleeding 01/22/2014  . Colon cancer     sister in 11s  . Colon cancer     niece in 54s  . Colon cancer     mother in 73s   Past Surgical History  Procedure Laterality Date  . Back surgery    . Tonsillectomy    . Adenoidectomy    . I&d extremity  03/14/2011    Procedure: IRRIGATION AND DEBRIDEMENT EXTREMITY;  Surgeon: Tennis Must, MD;  Location: [place];  Service: Orthopedics;  Laterality: Right;   Family History  Problem Relation Age of Onset  . Coronary artery disease Father   . Heart disease Father     before age 55  . Diabetes Mother   . Cancer Mother   . Deafness Daughter   . Other Daughter     grand gresis  . Stroke Son   . Stroke Maternal Grandfather   . Stroke Paternal Grandmother     heat stroke  . Stroke Paternal Grandfather   . Cancer Sister   . Cancer Maternal Aunt   . Cancer Maternal Uncle   . Cancer Other   . Cancer Maternal Aunt    History  Substance Use Topics  . Smoking status: Former Smoker    Types:  Cigarettes  . Smokeless tobacco: Never Used  . Alcohol Use: No   OB History    Gravida Para Term Preterm AB TAB SAB Ectopic Multiple Living   2 2 2       2      Review of Systems  Constitutional: Positive for fever.  Gastrointestinal: Positive for nausea, vomiting and abdominal pain. Negative for constipation.  Genitourinary: Positive for dysuria. Negative for difficulty urinating.  All other systems reviewed and are negative.     Allergies  Cefuroxime and Sulfa antibiotics  Home Medications   Prior to Admission medications   Medication Sig Start Date End Date Taking? Authorizing Provider  ALPRAZolam Duanne Moron) 0.5 MG tablet Take 0.5 mg by mouth 3 (three) times daily as needed for anxiety.   Yes Historical Provider, MD  amoxicillin (AMOXIL) 500 MG capsule Take 500 mg by mouth 2 (two) times daily.   Yes Historical Provider, MD  omeprazole (PRILOSEC) 20 MG capsule Take 2 capsules (40 mg total) by mouth daily. 10/15/13  Yes Merryl Hacker, MD   ciprofloxacin (CIPRO) 500 MG tablet Take 1 tablet (500 mg total) by mouth 2 (two) times daily. 02/15/14   Jasper Riling. Alvino Chapel, MD  HYDROcodone-acetaminophen [facility]) 7.5-325 MG per tablet Bring to office to take prior to procedure 02/15/14   Ovid Curd R. Talayah Picardi, MD   BP 121/60 mmHg  Pulse 91  Temp(Src) 99.1 F (37.3 C) (Rectal)  Resp 18  SpO2 100% Physical Exam  Constitutional: She is oriented to person, place, and time. She appears well-developed.  HENT:  Head: Normocephalic and atraumatic.  Eyes: Conjunctivae are normal.  Neck: Normal range of motion. Neck supple.  Cardiovascular: Normal rate, regular rhythm and normal heart sounds.   No murmur heard. Pulmonary/Chest: Effort normal and breath sounds normal. No respiratory distress. She has no wheezes. She has no rales.  Abdominal: Soft. There is tenderness. There is no rebound and no guarding.  Lower abdominal tenderness Some fullness in mid abdomen No ascites echcymossis  in right ingnuinal area but no signs of infection  Musculoskeletal: Normal range of motion.  Neurological: She is alert and oriented to person, place, and time.  Skin: Skin is warm and dry.  Psychiatric: She has a normal mood and affect.  Nursing note and vitals reviewed.   ED Course  Procedures (including critical care time)  DIAGNOSTIC STUDIES: Oxygen Saturation is 100% on room air, normal by my interpretation.    COORDINATION OF CARE: 3:24 PM Discussed treatment plan with patient at beside, the patient agrees with the plan and has no further questions at this time.   Labs Review Labs Reviewed  CBC WITH DIFFERENTIAL - Abnormal; Notable for the following:    RBC 3.84 (*)    Hemoglobin 11.0 (*)    HCT 34.5 (*)    All other components within normal limits  COMPREHENSIVE METABOLIC PANEL - Abnormal; Notable for the following:    Glucose, Bld 122 (*)    GFR calc non Af Amer 86 (*)    All other components within normal limits  URINALYSIS, ROUTINE W  REFLEX MICROSCOPIC - Abnormal; Notable for the following:    Ketones, ur 15 (*)    Leukocytes, UA MODERATE (*)    All other components within normal limits  URINE MICROSCOPIC-ADD ON - Abnormal; Notable for the following:    Bacteria, UA FEW (*)    All other components within normal limits  URINE CULTURE    Imaging Review No results found.   EKG  Interpretation None      MDM   Final diagnoses:  Acute lower UTI  Endometrial cancer    Patient with pain likely due to metastatic endometrial cancer. Patient is not seem willing to admit the diagnosis that she has cancer. Has potentially mild urinary tract infection. Pain improved after treatment. Patient is not ill-appearing. Will discharge home with antibiotics to cover UTI. Culture was sent.  I personally performed the services described in this documentation, which was scribed in my presence. The recorded information has been reviewed and is accurate.     Jasper Riling. Alvino Chapel, MD 02/15/14 2340

## 2014-02-18 ENCOUNTER — Telehealth: Payer: Self-pay | Admitting: Gynecologic Oncology

## 2014-02-18 LAB — URINE CULTURE: Colony Count: 9000

## 2014-02-18 NOTE — Telephone Encounter (Addendum)
Returned call to patient's sister.  Wanting to discuss path results.  Results discussed.  She would like the patient to find out the results at her appt on Wed. because they feel she could not handle the news over the phone by herself.  Patient's sister stating the patient says she only has 30 days left to live and is on a countdown.  She also states she has a 20 lb tumor inside of her.  Reporting moderate flight of ideas.  Advised to call for any questions or concerns.  Discussed why surgery is not an option at that time.

## 2014-02-19 ENCOUNTER — Encounter: Payer: Self-pay | Admitting: Radiation Oncology

## 2014-02-20 ENCOUNTER — Encounter: Payer: Self-pay | Admitting: Radiation Oncology

## 2014-02-20 ENCOUNTER — Ambulatory Visit
Admission: RE | Admit: 2014-02-20 | Discharge: 2014-02-20 | Disposition: A | Discharge status: Home / Self Care | Payer: Medicare Other | Source: Ambulatory Visit | Attending: Radiation Oncology | Admitting: Radiation Oncology

## 2014-02-20 ENCOUNTER — Encounter: Payer: Self-pay | Admitting: *Deleted

## 2014-02-20 VITALS — BP 145/102 | HR 88 | Temp 97.7°F | Resp 20 | Ht 64.0 in | Wt 161.3 lb

## 2014-02-20 DIAGNOSIS — Z87891 Personal history of nicotine dependence: Secondary | ICD-10-CM | POA: Diagnosis not present

## 2014-02-20 DIAGNOSIS — K6289 Other specified diseases of anus and rectum: Secondary | ICD-10-CM | POA: Diagnosis not present

## 2014-02-20 DIAGNOSIS — C779 Secondary and unspecified malignant neoplasm of lymph node, unspecified: Secondary | ICD-10-CM | POA: Insufficient documentation

## 2014-02-20 DIAGNOSIS — K8020 Calculus of gallbladder without cholecystitis without obstruction: Secondary | ICD-10-CM | POA: Insufficient documentation

## 2014-02-20 DIAGNOSIS — R1030 Lower abdominal pain, unspecified: Secondary | ICD-10-CM | POA: Insufficient documentation

## 2014-02-20 DIAGNOSIS — C541 Malignant neoplasm of endometrium: Secondary | ICD-10-CM | POA: Diagnosis not present

## 2014-02-20 DIAGNOSIS — C7951 Secondary malignant neoplasm of bone: Secondary | ICD-10-CM | POA: Diagnosis not present

## 2014-02-20 DIAGNOSIS — Z510 Encounter for antineoplastic radiation therapy: Secondary | ICD-10-CM | POA: Insufficient documentation

## 2014-02-20 DIAGNOSIS — K449 Diaphragmatic hernia without obstruction or gangrene: Secondary | ICD-10-CM | POA: Diagnosis not present

## 2014-02-20 HISTORY — DX: Allergy, unspecified, initial encounter: T78.40XA
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
## 2014-02-20 NOTE — Progress Notes (Signed)
Please see the Nurse Progress Note in the MD Initial Consult Encounter for this patient. 

## 2014-02-26 ENCOUNTER — Encounter: Payer: Self-pay | Admitting: Radiation Oncology

## 2014-02-26 ENCOUNTER — Ambulatory Visit
Admission: RE | Admit: 2014-02-26 | Discharge: 2014-02-26 | Disposition: A | Discharge status: Home / Self Care | Payer: Medicare Other | Source: Ambulatory Visit | Attending: Radiation Oncology | Admitting: Radiation Oncology

## 2014-02-26 ENCOUNTER — Encounter: Payer: Self-pay | Admitting: Gynecologic Oncology

## 2014-02-26 ENCOUNTER — Other Ambulatory Visit: Payer: Self-pay | Admitting: Gynecologic Oncology

## 2014-02-26 DIAGNOSIS — Z510 Encounter for antineoplastic radiation therapy: Secondary | ICD-10-CM | POA: Diagnosis present

## 2014-02-26 DIAGNOSIS — K449 Diaphragmatic hernia without obstruction or gangrene: Secondary | ICD-10-CM | POA: Diagnosis not present

## 2014-02-26 DIAGNOSIS — C779 Secondary and unspecified malignant neoplasm of lymph node, unspecified: Secondary | ICD-10-CM | POA: Diagnosis not present

## 2014-02-26 DIAGNOSIS — C541 Malignant neoplasm of endometrium: Secondary | ICD-10-CM | POA: Diagnosis not present

## 2014-02-26 DIAGNOSIS — K8020 Calculus of gallbladder without cholecystitis without obstruction: Secondary | ICD-10-CM | POA: Diagnosis not present

## 2014-02-26 DIAGNOSIS — R1030 Lower abdominal pain, unspecified: Secondary | ICD-10-CM | POA: Diagnosis not present

## 2014-02-26 DIAGNOSIS — C7951 Secondary malignant neoplasm of bone: Secondary | ICD-10-CM | POA: Diagnosis not present

## 2014-02-26 DIAGNOSIS — K6289 Other specified diseases of anus and rectum: Secondary | ICD-10-CM | POA: Diagnosis not present

## 2014-02-26 DIAGNOSIS — Z87891 Personal history of nicotine dependence: Secondary | ICD-10-CM | POA: Diagnosis not present

## 2014-02-26 MED ORDER — HYDROCODONE-ACETAMINOPHEN 5-325 MG PO TABS: 1.0000 | ORAL_TABLET | ORAL | Status: DC | PRN

## 2014-02-26 MED ORDER — HYDROCODONE-ACETAMINOPHEN 5-325 MG PO TABS
1.0000 | ORAL_TABLET | Freq: Once | ORAL | Status: AC
  Administered 2014-02-26: 1 via ORAL
  Filled 2014-02-26: qty 1

## 2014-02-26 NOTE — Progress Notes (Signed)
[facility] Simulation and Treatment Planning Note   Name: Tanya Ellis MRN: [id number]  Date: 02/26/2014  DOB: 1940-11-13  Status: Outpatient     DIAGNOSIS: The encounter diagnosis was Endometrial cancer.    CONSENT VERIFIED: yes   SET UP AND IMMOBILIZATION: Patient is setup supine with legs in a vacloc   NARRATIVE: The patient was brought to the [facility].  Identity was confirmed.  All relevant records and images related to the planned course of therapy were reviewed.  Then, the patient was positioned in a stable reproducible clinical set-up for radiation therapy.  CT images were obtained.  Skin markings were placed.  The CT images were loaded into the planning software where the target and avoidance structures were contoured.  The radiation prescription was entered and confirmed.   TREATMENT PLANNING NOTE:  Treatment planning then occurred. I have requested 3D simulation with [facility] of the spinal cord, total lungs and gross tumor volume. I have also requested mlcs and an isodose plan.   A total of 3 complex treatment devices will be used in the form of the vac loc and MLCs

## 2014-02-26 NOTE — Progress Notes (Signed)
Patient unable to ly on ct simulation table states too painful right hip 8-9/10 scale, per Dr. Pablo Ledger written order to give hydrocodone 5/325mg  x 1 tablet once, verified medication with Val Malloy,RN, asked patient in CT Sim room #2, patient's name and dob, pain stated 8-9/10 scale,"Im out of my pain medicine" stated patient,  gave 1 tablet hydrocodone 5/325mg  with glass water,  1100 am, also was given information that Gomez Cleverly NP GYN group,  to handle all pain medications Per Dr. Pablo Ledger, gave this information to the patient female companion,they can request to be seen upstairs 1st floor 11:07 AM
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
## 2014-02-27 ENCOUNTER — Ambulatory Visit: Payer: Medicare Other | Admitting: Radiation Oncology

## 2014-02-27 DIAGNOSIS — Z510 Encounter for antineoplastic radiation therapy: Secondary | ICD-10-CM | POA: Diagnosis not present

## 2014-02-28 ENCOUNTER — Ambulatory Visit: Payer: Medicare Other | Admitting: Oncology

## 2014-02-28 ENCOUNTER — Encounter: Payer: Self-pay | Admitting: Oncology

## 2014-02-28 ENCOUNTER — Other Ambulatory Visit: Payer: Self-pay | Admitting: Oncology

## 2014-02-28 ENCOUNTER — Other Ambulatory Visit: Payer: Medicare Other

## 2014-02-28 ENCOUNTER — Ambulatory Visit
Admission: RE | Admit: 2014-02-28 | Discharge: 2014-02-28 | Disposition: A | Discharge status: Home / Self Care | Payer: Medicare Other | Source: Ambulatory Visit | Attending: Radiation Oncology | Admitting: Radiation Oncology

## 2014-02-28 ENCOUNTER — Ambulatory Visit: Payer: Medicare Other

## 2014-02-28 ENCOUNTER — Encounter: Payer: Self-pay | Admitting: *Deleted

## 2014-02-28 ENCOUNTER — Telehealth: Payer: Self-pay | Admitting: *Deleted

## 2014-02-28 DIAGNOSIS — Z510 Encounter for antineoplastic radiation therapy: Secondary | ICD-10-CM | POA: Diagnosis not present

## 2014-02-28 DIAGNOSIS — C541 Malignant neoplasm of endometrium: Secondary | ICD-10-CM

## 2014-02-28 NOTE — Telephone Encounter (Signed)
Spoke with sister about missed appt. She will try to get family to reschedule appt with Dr Marko Plume and chemo class

## 2014-02-28 NOTE — Progress Notes (Signed)
Called patient b/c missed chemo class today and n'pt. app't with Dr. Marko Plume. After discussion with patient she will come on Friday 1/8 for a 9:30 class.   Will go to radiation at 11:15.  Patient had stated that she did not know anything about the 2 missed app't today.   Radiation had discussed it with her last week.   Verbalized understanding of app't for tomorrow. Dr. Marko Plume aware.

## 2014-02-28 NOTE — Progress Notes (Signed)
Patient was a no show for chemo class today 12:30.  Was here earlier for port films. Per Val in radiation oncology patient was aware of app't.  Does not know why no show for class. Will try to see if patient can come for 9:30 class on Friday before her radiation app't at 11:15.

## 2014-02-28 NOTE — Progress Notes (Signed)
No date/episodes as of today.

## 2014-03-01 ENCOUNTER — Other Ambulatory Visit: Payer: Medicare Other

## 2014-03-01 ENCOUNTER — Ambulatory Visit
Admission: RE | Admit: 2014-03-01 | Discharge: 2014-03-01 | Disposition: A | Discharge status: Home / Self Care | Payer: Medicare Other | Source: Ambulatory Visit | Attending: Radiation Oncology | Admitting: Radiation Oncology

## 2014-03-01 ENCOUNTER — Encounter: Payer: Self-pay | Admitting: Oncology

## 2014-03-01 DIAGNOSIS — Z510 Encounter for antineoplastic radiation therapy: Secondary | ICD-10-CM | POA: Diagnosis not present

## 2014-03-01 NOTE — Progress Notes (Signed)
No dates for treatment as of today.

## 2014-03-04 ENCOUNTER — Telehealth: Payer: Self-pay | Admitting: Oncology

## 2014-03-04 ENCOUNTER — Ambulatory Visit
Admission: RE | Admit: 2014-03-04 | Discharge: 2014-03-04 | Disposition: A | Discharge status: Home / Self Care | Payer: Medicare Other | Source: Ambulatory Visit | Attending: Radiation Oncology | Admitting: Radiation Oncology

## 2014-03-04 DIAGNOSIS — Z510 Encounter for antineoplastic radiation therapy: Secondary | ICD-10-CM | POA: Diagnosis not present

## 2014-03-04 NOTE — Telephone Encounter (Signed)
LEFT  MESSAGE FOR SISTER CARLEEN TO RETURN CALL.

## 2014-03-04 NOTE — Telephone Encounter (Signed)
S/W VAL IN RAD ONC AND GAVE NP APPT FOR 01/15 @ 2:30 W/DR. LIVESAY, CHEMO EDU @ 9:30.

## 2014-03-04 NOTE — Progress Notes (Signed)
[facility] Psychosocial Distress Screening Clinical Social Work  Clinical Social Work was referred by distress screening protocol.  The patient scored a 10 on the Psychosocial Distress Thermometer which indicates severe distress. Clinical Social Worker met with patient, patient's daughter and granddaughter in exam room after radiation oncologist to assess for distress and other psychosocial needs. CSW has met with patient previously in GYN oncology.  Patient reported feeling much less distress after discussing plan with Dr. Pablo Ledger and is relieved to hear she can keep her cats throughout treatment.  CSW met with patient's family in radiation lobby after visit and shared information regarding patient's plan of care.  Patient's family had many concerns for patient caring for herself and patient's grandaughter and daughter providing quality support at home.  CSW and patient/family discussed ways patient can be supported and CSW strongly encouraged healthy communication skills that can be utilized.  Patient was accompanied by her daughter, granddaughter, sister, niece, church friend, and neighbor.  ON[facility] DISTRESS SCREENING 02/20/2014  Screening Type Initial Screening  Distress experienced in past week (1-10) 10  Emotional problem type Depression;Nervousness/Anxiety  Information Concerns Type Lack of info about diagnosis;Lack of info about treatment  Physical Problem type Pain  Other "protecting my children and pets financially" listed as most distressing, pt has already seen L Tarry Kos, SW    Clinical Social Worker follow up needed: Yes.    If yes, follow up plan:  No additional needs at this time, encouraged patient and family to call CSW as needed.    Polo Riley, MSW, LCSW, OSW-C Clinical Social Worker [facility] [phone]

## 2014-03-05 ENCOUNTER — Ambulatory Visit
Admission: RE | Admit: 2014-03-05 | Discharge: 2014-03-05 | Disposition: A | Discharge status: Home / Self Care | Payer: Medicare Other | Source: Ambulatory Visit | Attending: Radiation Oncology | Admitting: Radiation Oncology

## 2014-03-05 ENCOUNTER — Other Ambulatory Visit: Payer: Self-pay | Admitting: Gynecologic Oncology

## 2014-03-05 VITALS — BP 144/69 | HR 82 | Temp 97.7°F | Wt 156.9 lb

## 2014-03-05 DIAGNOSIS — Z510 Encounter for antineoplastic radiation therapy: Secondary | ICD-10-CM | POA: Diagnosis not present

## 2014-03-05 DIAGNOSIS — C541 Malignant neoplasm of endometrium: Secondary | ICD-10-CM

## 2014-03-05 MED ORDER — HYDROCODONE-ACETAMINOPHEN 5-325 MG PO TABS: 1.0000 | ORAL_TABLET | ORAL | Status: DC | PRN

## 2014-03-05 MED ORDER — BIAFINE EX EMUL: CUTANEOUS | Status: DC | PRN

## 2014-03-05 NOTE — Progress Notes (Signed)
  Radiation Oncology         ([phone]) [phone] ________________________________  Name: Tanya Ellis MRN: [id number]  Date: 03/05/2014  DOB: Nov 13, 1940  Weekly Radiation Therapy Management  Diagnosis: Metastatic endometrial cancer with right buttock pain, constipation and rectal pain  Current Dose: 12 Gy     Planned Dose:  30 Gy  Narrative . . . . . . . . The patient presents for routine under treatment assessment.                                   The patient is to have pain in the right pelvis/buttocks area                                 Set-up films were reviewed.                                 The chart was checked. Physical Findings. . .  weight is 156 lb 14.4 oz (71.169 kg). Her temperature is 97.7 F (36.5 C). Her blood pressure is 144/69 and her pulse is 82. Her oxygen saturation is 100%. . the lungs are clear. The heart has a regular rhythm and rate. The abdomen is soft and nontender with normal bowel sounds. Impression . . . . . . . The patient is tolerating radiation. Plan . . . . . . . . . . . . Continue treatment as planned.  ________________________________   Blair Promise, PhD, MD
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
## 2014-03-05 NOTE — Progress Notes (Signed)
Weekly assessment of radiation to pelvis for endometrial ca.completed 4 of 10 treatments.Skin is pink.Reviewed clinic routine.Patient and grand daughter have been instructed on side effects.Push fluids to prevent bladder infection and given biafine to apply to pelvis twice daily.Skin is pink with sensation of burning.Reinforced that Dr.Wentworth will not provide pain medications.I spoke with Joylene John and they do want to speak with her which Ms.Cross will see today to discuss further.To see Dr.Livesay on Friday.

## 2014-03-06 ENCOUNTER — Telehealth: Payer: Self-pay

## 2014-03-06 ENCOUNTER — Ambulatory Visit
Admission: RE | Admit: 2014-03-06 | Discharge: 2014-03-06 | Disposition: A | Discharge status: Home / Self Care | Payer: Medicare Other | Source: Ambulatory Visit | Attending: Radiation Oncology | Admitting: Radiation Oncology

## 2014-03-06 DIAGNOSIS — Z510 Encounter for antineoplastic radiation therapy: Secondary | ICD-10-CM | POA: Diagnosis not present

## 2014-03-06 NOTE — Telephone Encounter (Signed)
Spoke with Tanya Ellis at [employer] to have patient sign patient form on which family members may have information regarding appointment, etc. And to add granddaughter as she is the responsible person to contact as she brings patient to appointments.

## 2014-03-07 ENCOUNTER — Ambulatory Visit
Admission: RE | Admit: 2014-03-07 | Discharge: 2014-03-07 | Disposition: A | Discharge status: Home / Self Care | Payer: Medicare Other | Source: Ambulatory Visit | Attending: Radiation Oncology | Admitting: Radiation Oncology

## 2014-03-07 DIAGNOSIS — C541 Malignant neoplasm of endometrium: Secondary | ICD-10-CM

## 2014-03-07 DIAGNOSIS — Z510 Encounter for antineoplastic radiation therapy: Secondary | ICD-10-CM | POA: Diagnosis not present

## 2014-03-07 MED ORDER — BIAFINE EX EMUL
CUTANEOUS | Status: DC | PRN
  Administered 2014-03-07: 11:00:00 via TOPICAL

## 2014-03-08 ENCOUNTER — Other Ambulatory Visit: Payer: Medicare Other

## 2014-03-08 ENCOUNTER — Ambulatory Visit: Payer: Medicare Other

## 2014-03-08 ENCOUNTER — Encounter: Payer: Self-pay | Admitting: Oncology

## 2014-03-08 ENCOUNTER — Ambulatory Visit (HOSPITAL_BASED_OUTPATIENT_CLINIC_OR_DEPARTMENT_OTHER): Payer: Medicare Other | Admitting: Oncology

## 2014-03-08 ENCOUNTER — Other Ambulatory Visit (HOSPITAL_BASED_OUTPATIENT_CLINIC_OR_DEPARTMENT_OTHER): Payer: Medicare Other

## 2014-03-08 ENCOUNTER — Ambulatory Visit
Admission: RE | Admit: 2014-03-08 | Discharge: 2014-03-08 | Disposition: A | Discharge status: Home / Self Care | Payer: Medicare Other | Source: Ambulatory Visit | Attending: Radiation Oncology | Admitting: Radiation Oncology

## 2014-03-08 ENCOUNTER — Other Ambulatory Visit (HOSPITAL_COMMUNITY): Payer: Self-pay | Admitting: Oncology

## 2014-03-08 ENCOUNTER — Other Ambulatory Visit: Payer: Self-pay | Admitting: Oncology

## 2014-03-08 VITALS — BP 150/71 | HR 93 | Temp 97.6°F | Resp 18 | Ht 64.0 in | Wt 158.6 lb

## 2014-03-08 DIAGNOSIS — C541 Malignant neoplasm of endometrium: Secondary | ICD-10-CM

## 2014-03-08 DIAGNOSIS — C801 Malignant (primary) neoplasm, unspecified: Secondary | ICD-10-CM

## 2014-03-08 DIAGNOSIS — D649 Anemia, unspecified: Secondary | ICD-10-CM

## 2014-03-08 DIAGNOSIS — I700 Atherosclerosis of aorta: Secondary | ICD-10-CM

## 2014-03-08 DIAGNOSIS — Z510 Encounter for antineoplastic radiation therapy: Secondary | ICD-10-CM | POA: Diagnosis not present

## 2014-03-08 DIAGNOSIS — D630 Anemia in neoplastic disease: Secondary | ICD-10-CM

## 2014-03-08 DIAGNOSIS — K8020 Calculus of gallbladder without cholecystitis without obstruction: Secondary | ICD-10-CM

## 2014-03-08 DIAGNOSIS — Z87891 Personal history of nicotine dependence: Secondary | ICD-10-CM

## 2014-03-08 DIAGNOSIS — C779 Secondary and unspecified malignant neoplasm of lymph node, unspecified: Secondary | ICD-10-CM

## 2014-03-08 DIAGNOSIS — C7951 Secondary malignant neoplasm of bone: Secondary | ICD-10-CM

## 2014-03-08 LAB — CBC WITH DIFFERENTIAL/PLATELET
BASO%: 0.4 % (ref 0.0–2.0)
Basophils Absolute: 0 10*3/uL (ref 0.0–0.1)
EOS%: 5.6 % (ref 0.0–7.0)
Eosinophils Absolute: 0.3 10*3/uL (ref 0.0–0.5)
HCT: 34.1 % — ABNORMAL LOW (ref 34.8–46.6)
HEMOGLOBIN: 10.9 g/dL — AB (ref 11.6–15.9)
LYMPH#: 1.2 10*3/uL (ref 0.9–3.3)
LYMPH%: 23.9 % (ref 14.0–49.7)
MCH: 28.5 pg (ref 25.1–34.0)
MCHC: 32 g/dL (ref 31.5–36.0)
MCV: 89 fL (ref 79.5–101.0)
MONO#: 0.5 10*3/uL (ref 0.1–0.9)
MONO%: 9.5 % (ref 0.0–14.0)
NEUT#: 3.1 10*3/uL (ref 1.5–6.5)
NEUT%: 60.6 % (ref 38.4–76.8)
Platelets: 107 10*3/uL — ABNORMAL LOW (ref 145–400)
RBC: 3.83 10*6/uL (ref 3.70–5.45)
RDW: 14.2 % (ref 11.2–14.5)
WBC: 5.2 10*3/uL (ref 3.9–10.3)

## 2014-03-08 LAB — COMPREHENSIVE METABOLIC PANEL (CC13)
ALT: 10 U/L (ref 0–55)
AST: 15 U/L (ref 5–34)
Albumin: 3.7 g/dL (ref 3.5–5.0)
Alkaline Phosphatase: 101 U/L (ref 40–150)
Anion Gap: 9 mEq/L (ref 3–11)
BUN: 9 mg/dL (ref 7.0–26.0)
CHLORIDE: 103 meq/L (ref 98–109)
CO2: 26 mEq/L (ref 22–29)
Calcium: 8.8 mg/dL (ref 8.4–10.4)
Creatinine: 0.7 mg/dL (ref 0.6–1.1)
EGFR: 83 mL/min/{1.73_m2} — ABNORMAL LOW (ref >90)
GLUCOSE: 124 mg/dL (ref 70–140)
Potassium: 4 mEq/L (ref 3.5–5.1)
SODIUM: 138 meq/L (ref 136–145)
Total Bilirubin: 0.44 mg/dL (ref 0.20–1.20)
Total Protein: 6.2 g/dL — ABNORMAL LOW (ref 6.4–8.3)

## 2014-03-08 MED ORDER — ONDANSETRON HCL 8 MG PO TABS: 8.0000 mg | ORAL_TABLET | Freq: Three times a day (TID) | ORAL | Status: AC | PRN
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
## 2014-03-08 NOTE — Progress Notes (Signed)
Checked in new pt with no financial concerns prior to seeing the dr.  Pt has 2 insurances so financial assistance may not be needed but she has [name]'s card for any billing or insurance questions or concerns. ° °

## 2014-03-10 ENCOUNTER — Other Ambulatory Visit: Payer: Self-pay | Admitting: Oncology

## 2014-03-10 ENCOUNTER — Encounter: Payer: Self-pay | Admitting: Oncology

## 2014-03-10 DIAGNOSIS — C7951 Secondary malignant neoplasm of bone: Secondary | ICD-10-CM | POA: Insufficient documentation

## 2014-03-10 DIAGNOSIS — C779 Secondary and unspecified malignant neoplasm of lymph node, unspecified: Secondary | ICD-10-CM | POA: Insufficient documentation

## 2014-03-10 DIAGNOSIS — I700 Atherosclerosis of aorta: Secondary | ICD-10-CM | POA: Insufficient documentation

## 2014-03-10 DIAGNOSIS — K8020 Calculus of gallbladder without cholecystitis without obstruction: Secondary | ICD-10-CM | POA: Insufficient documentation

## 2014-03-10 DIAGNOSIS — D630 Anemia in neoplastic disease: Secondary | ICD-10-CM | POA: Insufficient documentation

## 2014-03-10 DIAGNOSIS — Z87891 Personal history of nicotine dependence: Secondary | ICD-10-CM | POA: Insufficient documentation

## 2014-03-11 ENCOUNTER — Ambulatory Visit
Admission: RE | Admit: 2014-03-11 | Discharge: 2014-03-11 | Disposition: A | Discharge status: Home / Self Care | Payer: Medicare Other | Source: Ambulatory Visit | Attending: Radiation Oncology | Admitting: Radiation Oncology

## 2014-03-11 ENCOUNTER — Telehealth: Payer: Self-pay

## 2014-03-11 ENCOUNTER — Other Ambulatory Visit: Payer: Self-pay

## 2014-03-11 ENCOUNTER — Other Ambulatory Visit: Payer: Self-pay | Admitting: Oncology

## 2014-03-11 DIAGNOSIS — C7951 Secondary malignant neoplasm of bone: Secondary | ICD-10-CM

## 2014-03-11 DIAGNOSIS — C779 Secondary and unspecified malignant neoplasm of lymph node, unspecified: Secondary | ICD-10-CM

## 2014-03-11 DIAGNOSIS — Z510 Encounter for antineoplastic radiation therapy: Secondary | ICD-10-CM | POA: Diagnosis not present

## 2014-03-11 NOTE — Telephone Encounter (Signed)
Reviewed handout given to [facility] for grandmother's Zometa treatment.  Discussed the possibility of Osteonecrosis of the Jaw.  Recommended that Ms. Kolbe see dentist to evaluate teeth/gums / jaw and discuss and preventive measures while on Zometa.  Nichole verbalized understanding.   Ms. Kindig  needs an appointment between 9-12 for Zometa as Darden Amber has to get her children to and from school. POF placed to set up Zometa infusion by 03-22-14. Discussed with Dr. Marko Plume the difficulty with appointment for new patient with Dr. Whitney Muse as her only slot is late afternoon. Dr. Marko Plume will discuss situation with Elmyra Ricks and facilitate appointments to the center that Ms. Buege wants to be treated.

## 2014-03-11 NOTE — Telephone Encounter (Signed)
I am hoping to get zometa done at [facility] this week coordinating with RT, tho I think infusion very full.  If not at [facility], then hopefully will be done soon at [facility].  RN please give written information on zometa to granddaughter/ patient and go over with them .  Granddaughter is to check with our schedulers when they are here for RT on 1-18.

## 2014-03-11 NOTE — Telephone Encounter (Signed)
, °

## 2014-03-12 ENCOUNTER — Telehealth: Payer: Self-pay | Admitting: Oncology

## 2014-03-12 ENCOUNTER — Ambulatory Visit
Admission: RE | Admit: 2014-03-12 | Discharge: 2014-03-12 | Disposition: A | Discharge status: Home / Self Care | Payer: Medicare Other | Source: Ambulatory Visit | Attending: Radiation Oncology | Admitting: Radiation Oncology

## 2014-03-12 ENCOUNTER — Other Ambulatory Visit: Payer: Self-pay | Admitting: Oncology

## 2014-03-12 VITALS — BP 129/72 | HR 87 | Temp 99.1°F | Wt 154.5 lb

## 2014-03-12 DIAGNOSIS — C541 Malignant neoplasm of endometrium: Secondary | ICD-10-CM

## 2014-03-12 DIAGNOSIS — Z510 Encounter for antineoplastic radiation therapy: Secondary | ICD-10-CM | POA: Diagnosis not present

## 2014-03-12 MED ORDER — MORPHINE SULFATE 15 MG PO TABS: 15.0000 mg | ORAL_TABLET | ORAL | Status: DC | PRN

## 2014-03-12 NOTE — Telephone Encounter (Signed)
s.w. nicole per pof and advised on 1.29 appt....Marland Kitchenok and aware

## 2014-03-12 NOTE — Progress Notes (Signed)
Weekly Management Note Current Dose: 27Gy  Projected Dose:30 Gy   Narrative:  The patient presents for routine under treatment assessment.  CBCT/MVCT images/Port film x-rays were reviewed.  The chart was checked. Patient has increasing pain over right buttock/ineriro ramus. Taking 2 percocet every 4 hours with no pain relief. Out of pain meds. Granddaughter is keeping at her house and bringing over weekly dosages. Pt has diarrhea. "I went 20 times last night."  Appt with Dr. Whitney Muse next week for chemo. Some irritation of her perineum and anus. Using biafene with good relief topically.   Physical Findings:  Unchanged. Alert. No distress.   Vitals:  Filed Vitals:   03/12/14 1214  BP: 129/72  Pulse: 87  Temp: 99.1 F (37.3 C)   Weight:  Wt Readings from Last 3 Encounters:  03/12/14 154 lb 8 oz (70.081 kg)  03/08/14 158 lb 9.6 oz (71.94 kg)  02/20/14 161 lb 4.8 oz (73.165 kg)   Lab Results  Component Value Date   WBC 5.2 03/08/2014   HGB 10.9* 03/08/2014   HCT 34.1* 03/08/2014   MCV 89.0 03/08/2014   PLT 107* 03/08/2014   Lab Results  Component Value Date   CREATININE 0.7 03/08/2014   BUN 9.0 03/08/2014   NA 138 03/08/2014   K 4.0 03/08/2014   CL 103 02/15/2014   CO2 26 03/08/2014     Impression:  The patient is tolerating radiation.  Plan:  Continue treatment as planned. Add immodium. Gave instructions on its use. Instructions for sitz baths. Gave MSIR 15 mg #60 with no refills for increasing pain. (she had before and it helped).  Instructed her on dangers of multiple prescribers, etc.  If Dr. Whitney Muse assumes primary oncology care, hopefully she can coordinate now that granddaughter is protecting from diversion.

## 2014-03-13 ENCOUNTER — Encounter: Payer: Self-pay | Admitting: Radiation Oncology

## 2014-03-13 ENCOUNTER — Ambulatory Visit
Admission: RE | Admit: 2014-03-13 | Discharge: 2014-03-13 | Disposition: A | Discharge status: Home / Self Care | Payer: Medicare Other | Source: Ambulatory Visit | Attending: Radiation Oncology | Admitting: Radiation Oncology

## 2014-03-13 DIAGNOSIS — Z510 Encounter for antineoplastic radiation therapy: Secondary | ICD-10-CM | POA: Diagnosis not present

## 2014-03-13 NOTE — Progress Notes (Signed)
  Radiation Oncology         [phone] ________________________________  Name: Tanya Ellis MRN: [id number]  Date: 03/13/2014  DOB: [birth year]  End of Treatment Note  Diagnosis:   Metastatic endometrial cancer     Indication for treatment:  Palliative       Radiation treatment dates:   02/28/2014-03/13/2014  Site/dose:   Pelvis to t30 Gy in 10 fractions at 3 gy per fraction x 10 fractions  Beams/energy:   AP/PA with 15 MV photons  Narrative: The patient tolerated radiation treatment relatively well.   She unfortunately continued to have pain at the end of her treatment but we were hopeful that this would improve with time. She will follow up with Dr. Whitney Muse for discussion of chemotherapy.   Plan: The patient has completed radiation treatment. The patient will return to radiation oncology clinic for routine followup in one month. I advised them to call or return sooner if they have any questions or concerns related to their recovery or treatment.  ------------------------------------------------  Thea Silversmith, MD

## 2014-03-18 ENCOUNTER — Ambulatory Visit (HOSPITAL_COMMUNITY): Payer: Medicare Other | Admitting: Hematology & Oncology
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
## 2014-03-22 ENCOUNTER — Ambulatory Visit: Payer: Medicare Other

## 2014-03-22 ENCOUNTER — Encounter (HOSPITAL_COMMUNITY): Payer: Self-pay | Admitting: Hematology & Oncology

## 2014-03-22 ENCOUNTER — Telehealth: Payer: Self-pay | Admitting: Nutrition

## 2014-03-22 ENCOUNTER — Encounter (HOSPITAL_COMMUNITY): Payer: Medicare Other | Attending: Hematology & Oncology | Admitting: Hematology & Oncology

## 2014-03-22 VITALS — BP 100/55 | HR 84 | Temp 99.0°F | Resp 16 | Ht 62.25 in | Wt 153.5 lb

## 2014-03-22 DIAGNOSIS — C778 Secondary and unspecified malignant neoplasm of lymph nodes of multiple regions: Secondary | ICD-10-CM | POA: Diagnosis not present

## 2014-03-22 DIAGNOSIS — C541 Malignant neoplasm of endometrium: Secondary | ICD-10-CM

## 2014-03-22 DIAGNOSIS — C578 Malignant neoplasm of overlapping sites of female genital organs: Secondary | ICD-10-CM | POA: Diagnosis not present

## 2014-03-22 DIAGNOSIS — C7951 Secondary malignant neoplasm of bone: Secondary | ICD-10-CM | POA: Diagnosis not present

## 2014-03-22 DIAGNOSIS — C801 Malignant (primary) neoplasm, unspecified: Secondary | ICD-10-CM

## 2014-03-22 DIAGNOSIS — R5383 Other fatigue: Secondary | ICD-10-CM

## 2014-03-22 DIAGNOSIS — R109 Unspecified abdominal pain: Secondary | ICD-10-CM

## 2014-03-22 DIAGNOSIS — Z87891 Personal history of nicotine dependence: Secondary | ICD-10-CM | POA: Diagnosis not present

## 2014-03-22 DIAGNOSIS — C579 Malignant neoplasm of female genital organ, unspecified: Secondary | ICD-10-CM | POA: Diagnosis present

## 2014-03-22 LAB — CBC WITH DIFFERENTIAL/PLATELET
Basophils Absolute: 0.1 10*3/uL (ref 0.0–0.1)
Basophils Relative: 1 % (ref 0–1)
EOS PCT: 3 % (ref 0–5)
Eosinophils Absolute: 0.2 10*3/uL (ref 0.0–0.7)
HEMATOCRIT: 33.1 % — AB (ref 36.0–46.0)
Hemoglobin: 10.7 g/dL — ABNORMAL LOW (ref 12.0–15.0)
LYMPHS PCT: 15 % (ref 12–46)
Lymphs Abs: 1.1 10*3/uL (ref 0.7–4.0)
MCH: 28.8 pg (ref 26.0–34.0)
MCHC: 32.3 g/dL (ref 30.0–36.0)
MCV: 89.2 fL (ref 78.0–100.0)
Monocytes Absolute: 0.6 10*3/uL (ref 0.1–1.0)
Monocytes Relative: 9 % (ref 3–12)
NEUTROS ABS: 5.1 10*3/uL (ref 1.7–7.7)
Neutrophils Relative %: 72 % (ref 43–77)
PLATELETS: 121 10*3/uL — AB (ref 150–400)
RBC: 3.71 MIL/uL — ABNORMAL LOW (ref 3.87–5.11)
RDW: 14.2 % (ref 11.5–15.5)
WBC: 7.1 10*3/uL (ref 4.0–10.5)

## 2014-03-22 LAB — COMPREHENSIVE METABOLIC PANEL
ALBUMIN: 3.8 g/dL (ref 3.5–5.2)
ALT: 13 U/L (ref 0–35)
AST: 19 U/L (ref 0–37)
Alkaline Phosphatase: 82 U/L (ref 39–117)
Anion gap: 5 (ref 5–15)
BUN: 11 mg/dL (ref 6–23)
CALCIUM: 8.8 mg/dL (ref 8.4–10.5)
CHLORIDE: 102 mmol/L (ref 96–112)
CO2: 26 mmol/L (ref 19–32)
Creatinine, Ser: 0.68 mg/dL (ref 0.50–1.10)
GFR calc non Af Amer: 83 mL/min — ABNORMAL LOW (ref >90)
GLUCOSE: 124 mg/dL — AB (ref 70–99)
Potassium: 3.9 mmol/L (ref 3.5–5.1)
SODIUM: 133 mmol/L — AB (ref 135–145)
TOTAL PROTEIN: 6.5 g/dL (ref 6.0–8.3)
Total Bilirubin: 0.8 mg/dL (ref 0.3–1.2)

## 2014-03-22 MED ORDER — MORPHINE SULFATE ER 15 MG PO TBCR: 15.0000 mg | EXTENDED_RELEASE_TABLET | Freq: Two times a day (BID) | ORAL | Status: DC

## 2014-03-22 MED ORDER — MORPHINE SULFATE 15 MG PO TABS: 15.0000 mg | ORAL_TABLET | ORAL | Status: DC | PRN

## 2014-03-22 NOTE — Telephone Encounter (Signed)
Patient was identified to be at risk for malnutrition on the MST secondary to poor appetite and weight loss. Contacted patient by phone.  Spoke with patient's granddaughter. Patient has had poor appetite which granddaughter attributes to increased pain. Patient has been encouraged to eat small amounts throughout the day and is drinking Carnation breakfast essentials. Educated granddaughter on sources of high protein, high calorie foods. Recommended patient consume Carnation breakfast essentials twice a day between meals. Will mail fact sheets and coupons along with contact information. Questions were answered and teach back method used.  **Disclaimer: This note was dictated with voice recognition software. Similar sounding words can inadvertently be transcribed and this note may contain transcription errors which may not have been corrected upon publication of note.**

## 2014-03-22 NOTE — Progress Notes (Signed)
Tanya Ellis presented for labwork. Labs per MD order drawn via Peripheral Line 23 gauge needle inserted in right AC  Good blood return present. Procedure without incident.  Needle removed intact. Patient tolerated procedure well.

## 2014-03-22 NOTE — Patient Instructions (Addendum)
[place] at [facility]  Discharge Instructions:  We discussed additional treatment of your cancer with chemotherapy We also addressed strengthening your bones with Zometa.  You had discussed these issues in the past with Dr. Marko Plume. You will need a port placed. We have arranged this for you. We will also get you back in with your dentist  _______________________________________________________________  Thank you for choosing [place] at [facility] to provide your oncology and hematology care.  To afford each patient quality time with our providers, please arrive at least 15 minutes before your scheduled appointment.  You need to re-schedule your appointment if you arrive 10 or more minutes late.  We strive to give you quality time with our providers, and arriving late affects you and other patients whose appointments are after yours.  Also, if you no show three or more times for appointments you may be dismissed from the clinic.  Again, thank you for choosing [facility] at [facility] hope is that these requests will allow you access to exceptional care and in a timely manner. _______________________________________________________________  If you have questions after your visit, please contact our office at [phone] between the hours of 8:30 a.m. and 5:00 p.m. Voicemails left after 4:30 p.m. will not be returned until the following business day. _______________________________________________________________  For prescription refill requests, have your pharmacy contact our office. _______________________________________________________________  Recommendations made by the consultant and any test results will be sent to your referring physician. _______________________________________________________________  Many cancer patients experience diarrhea while they are undergoing chemotherapy treatment.    Diarrhea is a liquid-like loose stool or an increase in the number of bowel movements you usually have.    At the first sign of poorly formed or loose stools you should begin taking Imodium (loperamide).  Take two caplets (4mg ) followed by one caplet (2mg ) every two hours until you have had no diarrhea for 12 hours.  During the night take two caplets (4mg ) at bed time and continue every four hours during the night until the morning  Stop taking Imodium only after there is no sign of diarrhea for 12 hours  ___________________________________________________________________________________  It is common for patients who are undergoing treatment and taking certain prescribed medications to experience side-effects with constipation.  If you experience constipation, please take stool softener such as Colace or Senna one tablet twice a day everyday to avoid constipation.  These medications are available over the counter.  Of course, if you have diarrhea, stop taking stool softeners.  Drinking plenty of fluid, eating fruits and vegetable, and being active also reduces the risk of constipation.   If despite taking stool softeners, and you still have no bowel movement for 2 days or more than your normal bowel habit frequency, please take one of the following over the counter laxatives:  MiraLax, Milk of Magnesia or Mag Citrate everyday and contact me immediately for further instructions.  The goal is to have at least one bowel movement every other day.

## 2014-03-23 LAB — CA 125: CA 125: 51 U/mL — ABNORMAL HIGH (ref <35)

## 2014-03-25 ENCOUNTER — Other Ambulatory Visit (HOSPITAL_COMMUNITY): Payer: Self-pay | Admitting: Hematology & Oncology

## 2014-03-25 MED ORDER — PROCHLORPERAZINE MALEATE 10 MG PO TABS: 10.0000 mg | ORAL_TABLET | Freq: Four times a day (QID) | ORAL | Status: DC | PRN

## 2014-03-25 MED ORDER — CALCIUM-VITAMIN D3 600-500 MG-UNIT PO CAPS: 1.0000 | ORAL_CAPSULE | Freq: Two times a day (BID) | ORAL | Status: AC

## 2014-03-25 MED ORDER — LIDOCAINE-PRILOCAINE 2.5-2.5 % EX CREA: TOPICAL_CREAM | CUTANEOUS | Status: AC

## 2014-03-25 NOTE — Patient Instructions (Addendum)
[facility]   CHEMOTHERAPY INSTRUCTIONS  Premeds: Benadryl - intravenously - antihistamine- this is being given to reduce your risk of having an allergic reaction to the Taxol chemo. Pepcid - intravenously - a different kind of antihistamine - this will also reduce your risk of having an allergic reaction to the Taxol chemo. Dexamethasone - steroid- being given to reduce the risk of you having an allergic reaction to the chemo. Side Effects of Dex: feeling flushed or looking red in the face/neck, increase in energy, trouble sleeping. Zofran - for nausea prevention/reduction. (takes 1 hour for all premeds to infuse)  Taxol - the first time you receive this drug we will titrate it very slowly to ensure that you do not have or are not having an allergic reaction to the chemo. Side Effects: hair loss, lowers your white blood cells (fight infection), muscle aches, nausea/vomiting, irritation to the mouth (mouth sores, pain in your mouth) *neuropathy - numbness/tingling/burning in hands/fingers/feet/toes. We need to know as soon as this begins to happen so that we can monitor it and treat if necessary. The numbness generally begins in the fingertips of tips of toes and then begins to travel up the finger/toe/hand/foot. We never want you getting to where you can't pick up a pen, coin, zip a zipper, button a button, or have trouble walking. You must tell us immediately if you are experiencing peripheral neuropathy!  (takes 1 hour to infuse)   Carboplatin - this medication can be hard on your kidneys - this is why we need you to drink 64 oz of fluid (preferably water/decaff fluids) 2 days prior to chemo and for up to 4-5 days after chemo. Drink more if you can. This will help to keep your kidneys flushed. This can cause mild hair loss, lower your platelets (which make your blood clot), lower your white blood cells (fight infection), and cause nausea/vomiting.   (takes 30 minutes to  infuse)  You will receive Taxol and Carboplatin 1 day a week for 2 weeks in a row. You will then have a week off. Then it will repeat.  Zometa -  lowers the amount of calcium loss from bone. When cancer has spread to the bone, it can begin to weaken the bone and cause bone loss. This could result in fracturing of the bones as well. Therefore, we give this medication intravenously every 28 days. Side effects of this medication include: a lowering of the calcium in your bloodstream and jaw pain. Please make sure you tell us immediately if your jaw begins to hurt, becomes sore, or tender. Do NOT go to the dentist without letting us know first. You must tell us prior to going to the dentist so we can determine the safest time for dental cleanings and procedures. You must tell your dentist you are on this medication also. We will have you take Calcium 1200mg  and Vit D 1000mg  daily also.    POTENTIAL SIDE EFFECTS OF TREATMENT: Increased Susceptibility to Infection, Vomiting, Constipation, Hair Thinning, Changes in Character of Skin and Nails (brittleness, dryness,etc.), Bone Marrow Suppression, Abdominal Cramping, Complete Hair Loss, Nausea, Diarrhea and Mouth Sores   SELF IMAGE NEEDS AND REFERRALS MADE: Obtain hair accessories as soon as possible (wigs, scarves, turbans,caps,etc.)   EDUCATIONAL MATERIALS GIVEN AND REVIEWED: Chemotherapy and You booklet Specific Instructions Sheets: Carboplatin, Taxol, Pepcid, Benadryl, Zofran, Dexamethasone, EMLA cream, Zometa   SELF CARE ACTIVITIES WHILE ON CHEMOTHERAPY: Increase your fluid intake 48 hours prior to  treatment and drink at least 2 quarts per day after treatment., No alcohol intake., No aspirin or other medications unless approved by your oncologist., Eat foods that are light and easy to digest., Eat foods at cold or room temperature., No fried, fatty, or spicy foods immediately before or after treatment., Have teeth cleaned professionally before  starting treatment. Keep dentures and partial plates clean., Use soft toothbrush and do not use mouthwashes that contain alcohol. Biotene is a good mouthwash that is available at most pharmacies or may be ordered by calling [phone]., Use warm salt water gargles (1 teaspoon salt per 1 quart warm water) before and after meals and at bedtime. Or you may rinse with 2 tablespoons of three -percent hydrogen peroxide mixed in eight ounces of water., Always use sunscreen with SPF (Sun Protection Factor) of 30 or higher., Use your nausea medication as directed to prevent nausea., Use your stool softener or laxative as directed to prevent constipation. and Use your anti-diarrheal medication as directed to stop diarrhea.  Please wash your hands for at least 30 seconds using warm soapy water. Handwashing is the #1 way to prevent the spread of germs. Stay away from sick people or people who are getting over a cold. If you develop respiratory systems such as green/yellow mucus production or productive cough or persistent cough let us know and we will see if you need an antibiotic. It is a good idea to keep a pair of gloves on when going into grocery stores/[employer] to decrease your risk of coming into contact with germs on the carts, etc. Carry alcohol hand gel with you at all times and use it frequently if out in public. All foods need to be cooked thoroughly. No raw foods. No medium or undercooked meats, eggs. If your food is cooked medium well, it does not need to be hot pink or saturated with bloody liquid at all. Vegetables and fruits need to be washed/rinsed under the faucet with a dish detergent before being consumed. You can eat raw fruits and vegetables unless we tell you otherwise but it would be best if you cooked them or bought frozen. Do not eat off of salad bars or hot bars unless you really trust the cleanliness of the restaurant. If you need dental work, please let Dr. Whitney Muse know before you go for your  appointment so that we can coordinate the best possible time for you in regards to your chemo regimen. You need to also let your dentist know that you are actively taking chemo. We may need to do labs prior to your dental appointment. We also want your bowels moving at least every other day. If this is not happening, we need to know so that we can get you on a bowel regimen to help you go.   Zometa - this medication will be given every 28 days through your IV. This medication will be given to protect your bones from bone loss     MEDICATIONS: You have been given prescriptions for the following medications:  Zofran/ondansetron 8mg  tablet. May take 1 tablet every 8 hours as needed for nausea/vomiting.  Compazine 10mg  tablet. May take 1 tablet every 6 hours as needed for nausea/vomiting.   Calcium/Vit D. Take 1 tablet two times a day.   EMLA cream. Apply a quarter size amount to port site 1 hour prior to chemo. Do not rub in. Cover with plastic wrap.    Over-the-Counter Meds:  Senna - this is a mild laxative  used to treat mild constipation. May take 1-8 tabs by mouth daily as needed for mild constipation.  Milk of Magnesia - this is a laxative used to treat moderate to severe constipation. May take 2-4 tablespoons every 8 hours as needed. May increase to 8 tablespoons x 1 dose and if no bowel movement call the [facility].  Imodium - this is for diarrhea. Take 2 tabs after 1st loose stool and then 1 tab every 2 hours until you go a total of 12 hours without a loose stool. Call [facility] if loose stools continue.   SYMPTOMS TO REPORT AS SOON AS POSSIBLE AFTER TREATMENT:  FEVER GREATER THAN 100.5 F  CHILLS WITH OR WITHOUT FEVER  NAUSEA AND VOMITING THAT IS NOT CONTROLLED WITH YOUR NAUSEA MEDICATION  UNUSUAL SHORTNESS OF BREATH  UNUSUAL BRUISING OR BLEEDING  TENDERNESS IN MOUTH AND THROAT WITH OR WITHOUT PRESENCE OF ULCERS  URINARY PROBLEMS  BOWEL PROBLEMS  UNUSUAL  RASH    Wear comfortable clothing and clothing appropriate for easy access to any Portacath or PICC line. Let us know if there is anything that we can do to make your therapy better!      I have been informed and understand all of the instructions given to me and have received a copy. I have been instructed to call the clinic [phone] or my family physician as soon as possible for continued medical care, if indicated. I do not have any more questions at this time but understand that I may call the [facility] or the Patient Navigator at [phone] during office hours should I have questions or need assistance in obtaining follow-up care.            Carboplatin injection What is this medicine? CARBOPLATIN (KAR boe pla tin) is a chemotherapy drug. It targets fast dividing cells, like cancer cells, and causes these cells to die. This medicine is used to treat ovarian cancer and many other cancers. This medicine may be used for other purposes; ask your health care provider or pharmacist if you have questions. COMMON BRAND NAME(S): Paraplatin What should I tell my health care provider before I take this medicine? They need to know if you have any of these conditions: -blood disorders -hearing problems -kidney disease -recent or ongoing radiation therapy -an unusual or allergic reaction to carboplatin, cisplatin, other chemotherapy, other medicines, foods, dyes, or preservatives -pregnant or trying to get pregnant -breast-feeding How should I use this medicine? This drug is usually given as an infusion into a vein. It is administered in a hospital or clinic by a specially trained health care professional. Talk to your pediatrician regarding the use of this medicine in children. Special care may be needed. Overdosage: If you think you have taken too much of this medicine contact a poison control center or emergency room at once. NOTE: This medicine is only for you. Do not  share this medicine with others. What if I miss a dose? It is important not to miss a dose. Call your doctor or health care professional if you are unable to keep an appointment. What may interact with this medicine? -medicines for seizures -medicines to increase blood counts like filgrastim, pegfilgrastim, sargramostim -some antibiotics like amikacin, gentamicin, neomycin, streptomycin, tobramycin -vaccines Talk to your doctor or health care professional before taking any of these medicines: -acetaminophen -aspirin -ibuprofen -ketoprofen -naproxen This list may not describe all possible interactions. Give your health care provider a list of all the medicines,  herbs, non-prescription drugs, or dietary supplements you use. Also tell them if you smoke, drink alcohol, or use illegal drugs. Some items may interact with your medicine. What should I watch for while using this medicine? Your condition will be monitored carefully while you are receiving this medicine. You will need important blood work done while you are taking this medicine. This drug may make you feel generally unwell. This is not uncommon, as chemotherapy can affect healthy cells as well as cancer cells. Report any side effects. Continue your course of treatment even though you feel ill unless your doctor tells you to stop. In some cases, you may be given additional medicines to help with side effects. Follow all directions for their use. Call your doctor or health care professional for advice if you get a fever, chills or sore throat, or other symptoms of a cold or flu. Do not treat yourself. This drug decreases your body's ability to fight infections. Try to avoid being around people who are sick. This medicine may increase your risk to bruise or bleed. Call your doctor or health care professional if you notice any unusual bleeding. Be careful brushing and flossing your teeth or using a toothpick because you may get an infection or  bleed more easily. If you have any dental work done, tell your dentist you are receiving this medicine. Avoid taking products that contain aspirin, acetaminophen, ibuprofen, naproxen, or ketoprofen unless instructed by your doctor. These medicines may hide a fever. Do not become pregnant while taking this medicine. Women should inform their doctor if they wish to become pregnant or think they might be pregnant. There is a potential for serious side effects to an unborn child. Talk to your health care professional or pharmacist for more information. Do not breast-feed an infant while taking this medicine. What side effects may I notice from receiving this medicine? Side effects that you should report to your doctor or health care professional as soon as possible: -allergic reactions like skin rash, itching or hives, swelling of the face, lips, or tongue -signs of infection - fever or chills, cough, sore throat, pain or difficulty passing urine -signs of decreased platelets or bleeding - bruising, pinpoint red spots on the skin, black, tarry stools, nosebleeds -signs of decreased red blood cells - unusually weak or tired, fainting spells, lightheadedness -breathing problems -changes in hearing -changes in vision -chest pain -high blood pressure -low blood counts - This drug may decrease the number of white blood cells, red blood cells and platelets. You may be at increased risk for infections and bleeding. -nausea and vomiting -pain, swelling, redness or irritation at the injection site -pain, tingling, numbness in the hands or feet -problems with balance, talking, walking -trouble passing urine or change in the amount of urine Side effects that usually do not require medical attention (report to your doctor or health care professional if they continue or are bothersome): -hair loss -loss of appetite -metallic taste in the mouth or changes in taste This list may not describe all possible side  effects. Call your doctor for medical advice about side effects. You may report side effects to FDA at 1-800-FDA-1088. Where should I keep my medicine? This drug is given in a hospital or clinic and will not be stored at home. NOTE: This sheet is a summary. It may not cover all possible information. If you have questions about this medicine, talk to your doctor, pharmacist, or health care provider.  2015, [employer]/Gold Standard. (2007-05-16 14:38:05)  Paclitaxel injection What is this medicine? PACLITAXEL (PAK li TAX el) is a chemotherapy drug. It targets fast dividing cells, like cancer cells, and causes these cells to die. This medicine is used to treat ovarian cancer, breast cancer, and other cancers. This medicine may be used for other purposes; ask your health care provider or pharmacist if you have questions. COMMON BRAND NAME(S): Onxol, Taxol What should I tell my health care provider before I take this medicine? They need to know if you have any of these conditions: -blood disorders -irregular heartbeat -infection (especially a virus infection such as chickenpox, cold sores, or herpes) -liver disease -previous or ongoing radiation therapy -an unusual or allergic reaction to paclitaxel, alcohol, polyoxyethylated castor oil, other chemotherapy agents, other medicines, foods, dyes, or preservatives -pregnant or trying to get pregnant -breast-feeding How should I use this medicine? This drug is given as an infusion into a vein. It is administered in a hospital or clinic by a specially trained health care professional. Talk to your pediatrician regarding the use of this medicine in children. Special care may be needed. Overdosage: If you think you have taken too much of this medicine contact a poison control center or emergency room at once. NOTE: This medicine is only for you. Do not share this medicine with others. What if I miss a dose? It is important not to miss your dose. Call your  doctor or health care professional if you are unable to keep an appointment. What may interact with this medicine? Do not take this medicine with any of the following medications: -disulfiram -metronidazole This medicine may also interact with the following medications: -cyclosporine -diazepam -ketoconazole -medicines to increase blood counts like filgrastim, pegfilgrastim, sargramostim -other chemotherapy drugs like cisplatin, doxorubicin, epirubicin, etoposide, teniposide, vincristine -quinidine -testosterone -vaccines -verapamil Talk to your doctor or health care professional before taking any of these medicines: -acetaminophen -aspirin -ibuprofen -ketoprofen -naproxen This list may not describe all possible interactions. Give your health care provider a list of all the medicines, herbs, non-prescription drugs, or dietary supplements you use. Also tell them if you smoke, drink alcohol, or use illegal drugs. Some items may interact with your medicine. What should I watch for while using this medicine? Your condition will be monitored carefully while you are receiving this medicine. You will need important blood work done while you are taking this medicine. This drug may make you feel generally unwell. This is not uncommon, as chemotherapy can affect healthy cells as well as cancer cells. Report any side effects. Continue your course of treatment even though you feel ill unless your doctor tells you to stop. In some cases, you may be given additional medicines to help with side effects. Follow all directions for their use. Call your doctor or health care professional for advice if you get a fever, chills or sore throat, or other symptoms of a cold or flu. Do not treat yourself. This drug decreases your body's ability to fight infections. Try to avoid being around people who are sick. This medicine may increase your risk to bruise or bleed. Call your doctor or health care professional if  you notice any unusual bleeding. Be careful brushing and flossing your teeth or using a toothpick because you may get an infection or bleed more easily. If you have any dental work done, tell your dentist you are receiving this medicine. Avoid taking products that contain aspirin, acetaminophen, ibuprofen, naproxen, or ketoprofen unless instructed by your doctor. These medicines may hide a  fever. Do not become pregnant while taking this medicine. Women should inform their doctor if they wish to become pregnant or think they might be pregnant. There is a potential for serious side effects to an unborn child. Talk to your health care professional or pharmacist for more information. Do not breast-feed an infant while taking this medicine. Men are advised not to father a child while receiving this medicine. What side effects may I notice from receiving this medicine? Side effects that you should report to your doctor or health care professional as soon as possible: -allergic reactions like skin rash, itching or hives, swelling of the face, lips, or tongue -low blood counts - This drug may decrease the number of white blood cells, red blood cells and platelets. You may be at increased risk for infections and bleeding. -signs of infection - fever or chills, cough, sore throat, pain or difficulty passing urine -signs of decreased platelets or bleeding - bruising, pinpoint red spots on the skin, black, tarry stools, nosebleeds -signs of decreased red blood cells - unusually weak or tired, fainting spells, lightheadedness -breathing problems -chest pain -high or low blood pressure -mouth sores -nausea and vomiting -pain, swelling, redness or irritation at the injection site -pain, tingling, numbness in the hands or feet -slow or irregular heartbeat -swelling of the ankle, feet, hands Side effects that usually do not require medical attention (report to your doctor or health care professional if they  continue or are bothersome): -bone pain -complete hair loss including hair on your head, underarms, pubic hair, eyebrows, and eyelashes -changes in the color of fingernails -diarrhea -loosening of the fingernails -loss of appetite -muscle or joint pain -red flush to skin -sweating This list may not describe all possible side effects. Call your doctor for medical advice about side effects. You may report side effects to FDA at 1-800-FDA-1088. Where should I keep my medicine? This drug is given in a hospital or clinic and will not be stored at home. NOTE: This sheet is a summary. It may not cover all possible information. If you have questions about this medicine, talk to your doctor, pharmacist, or health care provider.  2015, [employer]/Gold Standard. (2012-04-03 16:41:21) Famotidine injection What is this medicine? FAMOTIDINE (fa MOE ti deen) is a type of antihistamine that blocks the release of stomach acid. It is used to treat stomach or intestinal ulcers. It can relieve ulcer pain and discomfort, and the heartburn from acid reflux. This medicine may be used for other purposes; ask your health care provider or pharmacist if you have questions. COMMON BRAND NAME(S): Pepcid What should I tell my health care provider before I take this medicine? They need to know if you have any of these conditions: -kidney or liver disease -an unusual or allergic reaction to famotidine, other medicines, foods, dyes, or preservatives -pregnant or trying to get pregnant -breast-feeding How should I use this medicine? This medicine is for infusion into a vein. It is given by a health care professional in a hospital or clinic setting. Talk to your pediatrician regarding the use of this medicine in children. Special care may be needed. Overdosage: If you think you have taken too much of this medicine contact a poison control center or emergency room at once. NOTE: This medicine is only for you. Do not share  this medicine with others. What if I miss a dose? This does not apply. What may interact with this medicine? -delavirdine -itraconazole -ketoconazole This list may not describe all possible  interactions. Give your health care provider a list of all the medicines, herbs, non-prescription drugs, or dietary supplements you use. Also tell them if you smoke, drink alcohol, or use illegal drugs. Some items may interact with your medicine. What should I watch for while using this medicine? Tell your doctor or health care professional if your condition does not start to get better or gets worse. Do not take with aspirin, ibuprofen, or other antiinflammatory medicines. These can aggravate your condition. Do not smoke cigarettes or drink alcohol. These increase irritation in your stomach and can increase the time it will take for ulcers to heal. Cigarettes and alcohol can also worsen acid reflux or heartburn. If you get black, tarry stools or vomit up what looks like coffee grounds, call your doctor or health care professional at once. You may have a bleeding ulcer. What side effects may I notice from receiving this medicine? Side effects that you should report to your doctor or health care professional as soon as possible: -allergic reactions like skin rash, itching or hives, swelling of the face, lips, or tongue -agitation, nervousness -confusion -hallucinations Side effects that usually do not require medical attention (report to your doctor or health care professional if they continue or are bothersome): -constipation -diarrhea -dizziness -headache This list may not describe all possible side effects. Call your doctor for medical advice about side effects. You may report side effects to FDA at 1-800-FDA-1088. Where should I keep my medicine? This medicine is given in a hospital or clinic. You will not be given this medicine to store at home. NOTE: This sheet is a summary. It may not cover all  possible information. If you have questions about this medicine, talk to your doctor, pharmacist, or health care provider.  2015, [employer]/Gold Standard. (2007-06-14 13:24:51) Diphenhydramine injection What is this medicine? DIPHENHYDRAMINE (dye fen HYE dra meen) is an antihistamine. It is used to treat the symptoms of an allergic reaction and motion sickness. It is also used to treat Parkinson's disease. This medicine may be used for other purposes; ask your health care provider or pharmacist if you have questions. COMMON BRAND NAME(S): Benadryl What should I tell my health care provider before I take this medicine? They need to know if you have any of these conditions: -asthma or lung disease -glaucoma -high blood pressure or heart disease -liver disease -pain or difficulty passing urine -prostate trouble -ulcers or other stomach problems -an unusual or allergic reaction to diphenhydramine, antihistamines, other medicines foods, dyes, or preservatives -pregnant or trying to get pregnant -breast-feeding How should I use this medicine? This medicine is for injection into a vein or a muscle. It is usually given by a health care professional in a hospital or clinic setting. If you get this medicine at home, you will be taught how to prepare and give this medicine. Use exactly as directed. Take your medicine at regular intervals. Do not take your medicine more often than directed. It is important that you put your used needles and syringes in a special sharps container. Do not put them in a trash can. If you do not have a sharps container, call your pharmacist or healthcare provider to get one. Talk to your pediatrician regarding the use of this medicine in children. While this drug may be prescribed for selected conditions, precautions do apply. This medicine is not approved for use in newborns and premature babies. Patients over 61 years old may have a stronger reaction and need a smaller  dose.  Overdosage: If you think you have taken too much of this medicine contact a poison control center or emergency room at once. NOTE: This medicine is only for you. Do not share this medicine with others. What if I miss a dose? If you miss a dose, take it as soon as you can. If it is almost time for your next dose, take only that dose. Do not take double or extra doses. What may interact with this medicine? Do not take this medicine with any of the following medications: -MAOIs like Carbex, Eldepryl, Marplan, Nardil, and Parnate This medicine may also interact with the following medications: -alcohol -barbiturates, like phenobarbital -medicines for bladder spasm like oxybutynin, tolterodine -medicines for blood pressure -medicines for depression, anxiety, or psychotic disturbances -medicines for movement abnormalities or Parkinson's disease -medicines for sleep -other medicines for cold, cough or allergy -some medicines for the stomach like chlordiazepoxide, dicyclomine This list may not describe all possible interactions. Give your health care provider a list of all the medicines, herbs, non-prescription drugs, or dietary supplements you use. Also tell them if you smoke, drink alcohol, or use illegal drugs. Some items may interact with your medicine. What should I watch for while using this medicine? Your condition will be monitored carefully while you are receiving this medicine. Tell your doctor or healthcare professional if your symptoms do not start to get better or if they get worse. You may get drowsy or dizzy. Do not drive, use machinery, or do anything that needs mental alertness until you know how this medicine affects you. Do not stand or sit up quickly, especially if you are an older patient. This reduces the risk of dizzy or fainting spells. Alcohol may interfere with the effect of this medicine. Avoid alcoholic drinks. Your mouth may get dry. Chewing sugarless gum or sucking  hard candy, and drinking plenty of water may help. Contact your doctor if the problem does not go away or is severe. What side effects may I notice from receiving this medicine? Side effects that you should report to your doctor or health care professional as soon as possible: -allergic reactions like skin rash, itching or hives, swelling of the face, lips, or tongue -breathing problems -changes in vision -chills -confused, agitated, nervous -irregular or fast heartbeat -low blood pressure -seizures -tremor -trouble passing urine -unusual bleeding or bruising -unusually weak or tired Side effects that usually do not require medical attention (report to your doctor or health care professional if they continue or are bothersome): -constipation, diarrhea -drowsy -headache -loss of appetite -stomach upset, vomiting -sweating -thick mucous This list may not describe all possible side effects. Call your doctor for medical advice about side effects. You may report side effects to FDA at 1-800-FDA-1088. Where should I keep my medicine? Keep out of the reach of children. If you are using this medicine at home, you will be instructed on how to store this medicine. Throw away any unused medicine after the expiration date on the label. NOTE: This sheet is a summary. It may not cover all possible information. If you have questions about this medicine, talk to your doctor, pharmacist, or health care provider.  2015, [employer] Standard. (2007-05-30 14:28:35) Dexamethasone injection What is this medicine? DEXAMETHASONE (dex a METH a sone) is a corticosteroid. It is used to treat inflammation of the skin, joints, lungs, and other organs. Common conditions treated include asthma, allergies, and arthritis. It is also used for other conditions, like blood disorders and diseases of the adrenal  glands. This medicine may be used for other purposes; ask your health care provider or pharmacist if you  have questions. COMMON BRAND NAME(S): Decadron, Solurex What should I tell my health care provider before I take this medicine? They need to know if you have any of these conditions: -blood clotting problems -Cushing's syndrome -diabetes -glaucoma -heart problems or disease -high blood pressure -infection like herpes, measles, tuberculosis, or chickenpox -kidney disease -liver disease -mental problems -myasthenia gravis -osteoporosis -previous heart attack -seizures -stomach, ulcer or intestine disease including colitis and diverticulitis -thyroid problem -an unusual or allergic reaction to dexamethasone, corticosteroids, other medicines, lactose, foods, dyes, or preservatives -pregnant or trying to get pregnant -breast-feeding How should I use this medicine? This medicine is for injection into a muscle, joint, lesion, soft tissue, or vein. It is given by a health care professional in a hospital or clinic setting. Talk to your pediatrician regarding the use of this medicine in children. Special care may be needed. Overdosage: If you think you have taken too much of this medicine contact a poison control center or emergency room at once. NOTE: This medicine is only for you. Do not share this medicine with others. What if I miss a dose? This may not apply. If you are having a series of injections over a prolonged period, try not to miss an appointment. Call your doctor or health care professional to reschedule if you are unable to keep an appointment. What may interact with this medicine? Do not take this medicine with any of the following medications: -mifepristone, RU-486 -vaccines This medicine may also interact with the following medications: -amphotericin B -antibiotics like clarithromycin, erythromycin, and troleandomycin -aspirin and aspirin-like drugs -barbiturates like phenobarbital -carbamazepine -cholestyramine -cholinesterase inhibitors like donepezil, galantamine,  rivastigmine, and tacrine -cyclosporine -digoxin -diuretics -ephedrine -female hormones, like estrogens or progestins and birth control pills -indinavir -isoniazid -ketoconazole -medicines for diabetes -medicines that improve muscle tone or strength for conditions like myasthenia gravis -NSAIDs, medicines for pain and inflammation, like ibuprofen or naproxen -phenytoin -rifampin -thalidomide -warfarin This list may not describe all possible interactions. Give your health care provider a list of all the medicines, herbs, non-prescription drugs, or dietary supplements you use. Also tell them if you smoke, drink alcohol, or use illegal drugs. Some items may interact with your medicine. What should I watch for while using this medicine? Your condition will be monitored carefully while you are receiving this medicine. If you are taking this medicine for a long time, carry an identification card with your name and address, the type and dose of your medicine, and your doctor's name and address. This medicine may increase your risk of getting an infection. Stay away from people who are sick. Tell your doctor or health care professional if you are around anyone with measles or chickenpox. Talk to your health care provider before you get any vaccines that you take this medicine. If you are going to have surgery, tell your doctor or health care professional that you have taken this medicine within the last twelve months. Ask your doctor or health care professional about your diet. You may need to lower the amount of salt you eat. The medicine can increase your blood sugar. If you are a diabetic check with your doctor if you need help adjusting the dose of your diabetic medicine. What side effects may I notice from receiving this medicine? Side effects that you should report to your doctor or health care professional as soon as possible: -allergic reactions  like skin rash, itching or hives, swelling of  the face, lips, or tongue -black or tarry stools -change in the amount of urine -changes in vision -confusion, excitement, restlessness, a false sense of well-being -fever, sore throat, sneezing, cough, or other signs of infection, wounds that will not heal -hallucinations -increased thirst -mental depression, mood swings, mistaken feelings of self importance or of being mistreated -pain in hips, back, ribs, arms, shoulders, or legs -pain, redness, or irritation at the injection site -redness, blistering, peeling or loosening of the skin, including inside the mouth -rounding out of face -swelling of feet or lower legs -unusual bleeding or bruising -unusual tired or weak -wounds that do not heal Side effects that usually do not require medical attention (report to your doctor or health care professional if they continue or are bothersome): -diarrhea or constipation -change in taste -headache -nausea, vomiting -skin problems, acne, thin and shiny skin -touble sleeping -unusual growth of hair on the face or body -weight gain This list may not describe all possible side effects. Call your doctor for medical advice about side effects. You may report side effects to FDA at 1-800-FDA-1088. Where should I keep my medicine? This drug is given in a hospital or clinic and will not be stored at home. NOTE: This sheet is a summary. It may not cover all possible information. If you have questions about this medicine, talk to your doctor, pharmacist, or health care provider.  2015, [employer] Standard. (2007-06-01 14:04:12) Ondansetron injection What is this medicine? ONDANSETRON (on DAN se tron) is used to treat nausea and vomiting caused by chemotherapy. It is also used to prevent or treat nausea and vomiting after surgery. This medicine may be used for other purposes; ask your health care provider or pharmacist if you have questions. COMMON BRAND NAME(S): Zofran What should I tell my  health care provider before I take this medicine? They need to know if you have any of these conditions: -heart disease -history of irregular heartbeat -liver disease -low levels of magnesium or potassium in the blood -an unusual or allergic reaction to ondansetron, granisetron, other medicines, foods, dyes, or preservatives -pregnant or trying to get pregnant -breast-feeding How should I use this medicine? This medicine is for infusion into a vein. It is given by a health care professional in a hospital or clinic setting. Talk to your pediatrician regarding the use of this medicine in children. Special care may be needed. Overdosage: If you think you have taken too much of this medicine contact a poison control center or emergency room at once. NOTE: This medicine is only for you. Do not share this medicine with others. What if I miss a dose? This does not apply. What may interact with this medicine? Do not take this medicine with any of the following medications: -apomorphine -certain medicines for fungal infections like fluconazole, itraconazole, ketoconazole, posaconazole, voriconazole -cisapride -dofetilide -dronedarone -pimozide -thioridazine -ziprasidone This medicine may also interact with the following medications: -carbamazepine -certain medicines for depression, anxiety, or psychotic disturbances -fentanyl -linezolid -MAOIs like Carbex, Eldepryl, Marplan, Nardil, and Parnate -methylene blue (injected into a vein) -other medicines that prolong the QT interval (cause an abnormal heart rhythm) -phenytoin -rifampicin -tramadol This list may not describe all possible interactions. Give your health care provider a list of all the medicines, herbs, non-prescription drugs, or dietary supplements you use. Also tell them if you smoke, drink alcohol, or use illegal drugs. Some items may interact with your medicine. What should I watch for  while using this medicine? Your  condition will be monitored carefully while you are receiving this medicine. What side effects may I notice from receiving this medicine? Side effects that you should report to your doctor or health care professional as soon as possible: -allergic reactions like skin rash, itching or hives, swelling of the face, lips, or tongue -breathing problems -confusion -dizziness -fast or irregular heartbeat -feeling faint or lightheaded, falls -fever and chills -loss of balance or coordination -seizures -sweating -swelling of the hands and feet -tightness in the chest -tremors -unusually weak or tired Side effects that usually do not require medical attention (report to your doctor or health care professional if they continue or are bothersome): -constipation or diarrhea -headache This list may not describe all possible side effects. Call your doctor for medical advice about side effects. You may report side effects to FDA at 1-800-FDA-1088. Where should I keep my medicine? This drug is given in a hospital or clinic and will not be stored at home. NOTE: This sheet is a summary. It may not cover all possible information. If you have questions about this medicine, talk to your doctor, pharmacist, or health care provider.  2015, [employer] Standard. (2012-11-15 16:18:28) Zoledronic Acid injection (Hypercalcemia, Oncology) What is this medicine? ZOLEDRONIC ACID (ZOE le dron ik AS id) lowers the amount of calcium loss from bone. It is used to treat too much calcium in your blood from cancer. It is also used to prevent complications of cancer that has spread to the bone. This medicine may be used for other purposes; ask your health care provider or pharmacist if you have questions. COMMON BRAND NAME(S): Zometa What should I tell my health care provider before I take this medicine? They need to know if you have any of these conditions: -aspirin-sensitive asthma -cancer, especially if you are  receiving medicines used to treat cancer -dental disease or wear dentures -infection -kidney disease -receiving corticosteroids like dexamethasone or prednisone -an unusual or allergic reaction to zoledronic acid, other medicines, foods, dyes, or preservatives -pregnant or trying to get pregnant -breast-feeding How should I use this medicine? This medicine is for infusion into a vein. It is given by a health care professional in a hospital or clinic setting. Talk to your pediatrician regarding the use of this medicine in children. Special care may be needed. Overdosage: If you think you have taken too much of this medicine contact a poison control center or emergency room at once. NOTE: This medicine is only for you. Do not share this medicine with others. What if I miss a dose? It is important not to miss your dose. Call your doctor or health care professional if you are unable to keep an appointment. What may interact with this medicine? -certain antibiotics given by injection -NSAIDs, medicines for pain and inflammation, like ibuprofen or naproxen -some diuretics like bumetanide, furosemide -teriparatide -thalidomide This list may not describe all possible interactions. Give your health care provider a list of all the medicines, herbs, non-prescription drugs, or dietary supplements you use. Also tell them if you smoke, drink alcohol, or use illegal drugs. Some items may interact with your medicine. What should I watch for while using this medicine? Visit your doctor or health care professional for regular checkups. It may be some time before you see the benefit from this medicine. Do not stop taking your medicine unless your doctor tells you to. Your doctor may order blood tests or other tests to see how you are doing.  Women should inform their doctor if they wish to become pregnant or think they might be pregnant. There is a potential for serious side effects to an unborn child. Talk to  your health care professional or pharmacist for more information. You should make sure that you get enough calcium and vitamin D while you are taking this medicine. Discuss the foods you eat and the vitamins you take with your health care professional. Some people who take this medicine have severe bone, joint, and/or muscle pain. This medicine may also increase your risk for jaw problems or a broken thigh bone. Tell your doctor right away if you have severe pain in your jaw, bones, joints, or muscles. Tell your doctor if you have any pain that does not go away or that gets worse. Tell your dentist and dental surgeon that you are taking this medicine. You should not have major dental surgery while on this medicine. See your dentist to have a dental exam and fix any dental problems before starting this medicine. Take good care of your teeth while on this medicine. Make sure you see your dentist for regular follow-up appointments. What side effects may I notice from receiving this medicine? Side effects that you should report to your doctor or health care professional as soon as possible: -allergic reactions like skin rash, itching or hives, swelling of the face, lips, or tongue -anxiety, confusion, or depression -breathing problems -changes in vision -eye pain -feeling faint or lightheaded, falls -jaw pain, especially after dental work -mouth sores -muscle cramps, stiffness, or weakness -trouble passing urine or change in the amount of urine Side effects that usually do not require medical attention (report to your doctor or health care professional if they continue or are bothersome): -bone, joint, or muscle pain -constipation -diarrhea -fever -hair loss -irritation at site where injected -loss of appetite -nausea, vomiting -stomach upset -trouble sleeping -trouble swallowing -weak or tired This list may not describe all possible side effects. Call your doctor for medical advice about  side effects. You may report side effects to FDA at 1-800-FDA-1088. Where should I keep my medicine? This drug is given in a hospital or clinic and will not be stored at home. NOTE: This sheet is a summary. It may not cover all possible information. If you have questions about this medicine, talk to your doctor, pharmacist, or health care provider.  2015, [employer] Standard. (2012-07-20 13:03:13) Lidocaine; Prilocaine cream What is this medicine? LIDOCAINE; PRILOCAINE (LYE doe kane; PRIL oh kane) is a topical anesthetic that causes loss of feeling in the skin and surrounding tissues. It is used to numb the skin before procedures or injections. This medicine may be used for other purposes; ask your health care provider or pharmacist if you have questions. COMMON BRAND NAME(S): EMLA What should I tell my health care provider before I take this medicine? They need to know if you have any of these conditions: -glucose-6-phosphate deficiencies -heart disease -kidney or liver disease -methemoglobinemia -an unusual or allergic reaction to lidocaine, prilocaine, other medicines, foods, dyes, or preservatives -pregnant or trying to get pregnant -breast-feeding How should I use this medicine? This medicine is for external use only on the skin. Do not take by mouth. Follow the directions on the prescription label. Wash hands before and after use. Do not use more or leave in contact with the skin longer than directed. Do not apply to eyes or open wounds. It can cause irritation and blurred or temporary loss of vision. If  this medicine comes in contact with your eyes, immediately rinse the eye with water. Do not touch or rub the eye. Contact your health care provider right away. Talk to your pediatrician regarding the use of this medicine in children. While this medicine may be prescribed for children for selected conditions, precautions do apply. Overdosage: If you think you have taken too much of  this medicine contact a poison control center or emergency room at once. NOTE: This medicine is only for you. Do not share this medicine with others. What if I miss a dose? This medicine is usually only applied once prior to each procedure. It must be in contact with the skin for a period of time for it to work. If you applied this medicine later than directed, tell your health care professional before starting the procedure. What may interact with this medicine? -acetaminophen -chloroquine -dapsone -medicines to control heart rhythm -nitrates like nitroglycerin and nitroprusside -other ointments, creams, or sprays that may contain anesthetic medicine -phenobarbital -phenytoin -quinine -sulfonamides like sulfacetamide, sulfamethoxazole, sulfasalazine and others This list may not describe all possible interactions. Give your health care provider a list of all the medicines, herbs, non-prescription drugs, or dietary supplements you use. Also tell them if you smoke, drink alcohol, or use illegal drugs. Some items may interact with your medicine. What should I watch for while using this medicine? Be careful to avoid injury to the treated area while it is numb and you are not aware of pain. Avoid scratching, rubbing, or exposing the treated area to hot or cold temperatures until complete sensation has returned. The numb feeling will wear off a few hours after applying the cream. What side effects may I notice from receiving this medicine? Side effects that you should report to your doctor or health care professional as soon as possible: -blurred vision -chest pain -difficulty breathing -dizziness -drowsiness -fast or irregular heartbeat -skin rash or itching -swelling of your throat, lips, or face -trembling Side effects that usually do not require medical attention (report to your doctor or health care professional if they continue or are bothersome): -changes in ability to feel hot or  cold -redness and swelling at the application site This list may not describe all possible side effects. Call your doctor for medical advice about side effects. You may report side effects to FDA at 1-800-FDA-1088. Where should I keep my medicine? Keep out of reach of children. Store at room temperature between 15 and 30 degrees C (59 and 86 degrees F). Keep container tightly closed. Throw away any unused medicine after the expiration date. NOTE: This sheet is a summary. It may not cover all possible information. If you have questions about this medicine, talk to your doctor, pharmacist, or health care provider.  2015, [employer] Standard. (2007-08-14 17:14:35) Calcium; Vitamin D oral tablets What is this medicine? CALCIUM; VITAMIN D (KAL see um; VYE ta min D) is a vitamin supplement. It is used to prevent conditions of low calcium and vitamin D. This medicine may be used for other purposes; ask your health care provider or pharmacist if you have questions. COMMON BRAND NAME(S): Calcarb 600 with Vitamin D, Calcet Plus Vitamin D, Calcitrate + D, Caltrate, Caltrate 600+D, Citracal + D, Citracal MAXIMUM + D, Citracal Petites with Vitamin D, Citrus Calcium Plus D, OSCAL 500 + D, OSCAL Calcium + D3, OSCAL Extra D3, Osteo-Poretical, Oysco 500 + D, Oysco D What should I tell my health care provider before I take this medicine? They need  to know if you have any of these conditions: -constipation -dehydration -heart disease -high level of calcium or vitamin D in the blood -high level of phosphate in the blood -kidney disease -kidney stones -liver disease -parathyroid disease -sarcoidosis -stomach ulcer or obstruction -an unusual or allergic reaction to calcium, vitamin D, tartrazine dye, other medicines, foods, dyes, or preservatives -pregnant or trying to get pregnant -breast-feeding How should I use this medicine? Take this medicine by mouth with a glass of water. Follow the directions on  the label. Take with food or within 1 hour after a meal. Take your medicine at regular intervals. Do not take your medicine more often than directed. Talk to your pediatrician regarding the use of this medicine in children. While this medicine may be used in children for selected conditions, precautions do apply. Overdosage: If you think you have taken too much of this medicine contact a poison control center or emergency room at once. NOTE: This medicine is only for you. Do not share this medicine with others. What if I miss a dose? If you miss a dose, take it as soon as you can. If it is almost time for your next dose, take only that dose. Do not take double or extra doses. What may interact with this medicine? Do not take this medicine with any of the following medications: -ammonium chloride -methenamine This medicine may also interact with the following medications: -antibiotics like ciprofloxacin, gatifloxacin, tetracycline -captopril -delavirdine -diuretics -gabapentin -iron supplements -medicines for fungal infections like ketoconazole and itraconazole -medicines for seizures like ethotoin and phenytoin -mineral oil -mycophenolate -other vitamins with calcium, vitamin D, or minerals -quinidine -rosuvastatin -sucralfate -thyroid medicine This list may not describe all possible interactions. Give your health care provider a list of all the medicines, herbs, non-prescription drugs, or dietary supplements you use. Also tell them if you smoke, drink alcohol, or use illegal drugs. Some items may interact with your medicine. What should I watch for while using this medicine? Taking this medicine is not a substitute for a well-balanced diet and exercise. Talk with your doctor or health care provider and follow a healthy lifestyle. Do not take this medicine with high-fiber foods, large amounts of alcohol, or drinks containing caffeine. Do not take this medicine within 2 hours of any other  medicines. What side effects may I notice from receiving this medicine? Side effects that you should report to your doctor or health care professional as soon as possible: -allergic reactions like skin rash, itching or hives, swelling of the face, lips, or tongue -confusion -dry mouth -high blood pressure -increased hunger or thirst -increased urination -irregular heartbeat -metallic taste -muscle or bone pain -pain when urinating -seizure -unusually weak or tired -weight loss Side effects that usually do not require medical attention (report to your doctor or health care professional if they continue or are bothersome): -constipation -diarrhea -headache -loss of appetite -nausea, vomiting -stomach upset This list may not describe all possible side effects. Call your doctor for medical advice about side effects. You may report side effects to FDA at 1-800-FDA-1088. Where should I keep my medicine? Keep out of the reach of children. Store at room temperature between 15 and 30 degrees C (59 and 86 degrees F). Protect from light. Keep container tightly closed. Throw away any unused medicine after the expiration date. NOTE: This sheet is a summary. It may not cover all possible information. If you have questions about this medicine, talk to your doctor, pharmacist, or health care  provider.  2015, Elsevier/Gold Standard. (2007-05-24 17:56:23)

## 2014-03-26 ENCOUNTER — Encounter (HOSPITAL_COMMUNITY): Payer: Medicare Other | Attending: Hematology & Oncology

## 2014-03-26 DIAGNOSIS — C539 Malignant neoplasm of cervix uteri, unspecified: Secondary | ICD-10-CM | POA: Insufficient documentation

## 2014-03-26 DIAGNOSIS — Z87891 Personal history of nicotine dependence: Secondary | ICD-10-CM | POA: Insufficient documentation

## 2014-03-26 DIAGNOSIS — C541 Malignant neoplasm of endometrium: Secondary | ICD-10-CM

## 2014-03-26 MED ORDER — MORPHINE SULFATE ER 30 MG PO TBCR: 30.0000 mg | EXTENDED_RELEASE_TABLET | Freq: Two times a day (BID) | ORAL | Status: DC

## 2014-03-26 MED ORDER — MORPHINE SULFATE 15 MG PO TABS: 15.0000 mg | ORAL_TABLET | ORAL | Status: DC | PRN

## 2014-03-26 NOTE — Progress Notes (Signed)
Zometa consent obtained. Prescription given for Vit D/Calcium.

## 2014-03-26 NOTE — Progress Notes (Signed)
Chemo teaching done and consent signed for Carboplatin/Taxol. Med/chemo calendar given to patient and granddaughter. Distress screening done and to be entered by [employer] CSW.

## 2014-03-27 ENCOUNTER — Other Ambulatory Visit: Payer: Self-pay | Admitting: Radiology

## 2014-03-27 ENCOUNTER — Telehealth (HOSPITAL_COMMUNITY): Payer: Self-pay | Admitting: *Deleted

## 2014-03-27 ENCOUNTER — Encounter: Payer: Self-pay | Admitting: *Deleted
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
## 2014-03-27 NOTE — Telephone Encounter (Signed)
I let Dr. Clearance Coots office Venida Jarvis) know that Dr. Whitney Muse recommended just pulling 1 tooth right now. I also notified patient's granddaughter Elmyra Ricks. They both said ok.

## 2014-03-28 ENCOUNTER — Other Ambulatory Visit: Payer: Self-pay | Admitting: Radiology

## 2014-03-29 ENCOUNTER — Ambulatory Visit (HOSPITAL_COMMUNITY)
Admission: RE | Admit: 2014-03-29 | Discharge: 2014-03-29 | Disposition: A | Discharge status: Home / Self Care | Payer: Medicare Other | Source: Ambulatory Visit | Attending: Hematology & Oncology | Admitting: Hematology & Oncology

## 2014-03-29 ENCOUNTER — Ambulatory Visit (HOSPITAL_COMMUNITY)
Admission: RE | Admit: 2014-03-29 | Discharge: 2014-03-29 | Disposition: A | Discharge status: Home / Self Care | Payer: Medicare Other | Source: Ambulatory Visit | Attending: Oncology | Admitting: Oncology

## 2014-03-29 ENCOUNTER — Other Ambulatory Visit (HOSPITAL_COMMUNITY): Payer: Self-pay | Admitting: Oncology

## 2014-03-29 DIAGNOSIS — C541 Malignant neoplasm of endometrium: Secondary | ICD-10-CM

## 2014-03-29 DIAGNOSIS — Z87891 Personal history of nicotine dependence: Secondary | ICD-10-CM | POA: Insufficient documentation

## 2014-03-29 DIAGNOSIS — C801 Malignant (primary) neoplasm, unspecified: Secondary | ICD-10-CM | POA: Diagnosis not present

## 2014-03-29 DIAGNOSIS — K219 Gastro-esophageal reflux disease without esophagitis: Secondary | ICD-10-CM | POA: Insufficient documentation

## 2014-03-29 DIAGNOSIS — M7989 Other specified soft tissue disorders: Secondary | ICD-10-CM | POA: Diagnosis not present

## 2014-03-29 DIAGNOSIS — C7951 Secondary malignant neoplasm of bone: Secondary | ICD-10-CM | POA: Diagnosis not present

## 2014-03-29 DIAGNOSIS — Z452 Encounter for adjustment and management of vascular access device: Secondary | ICD-10-CM | POA: Insufficient documentation

## 2014-03-29 DIAGNOSIS — K449 Diaphragmatic hernia without obstruction or gangrene: Secondary | ICD-10-CM | POA: Diagnosis not present

## 2014-03-29 LAB — PROTIME-INR
INR: 1.06 (ref 0.00–1.49)
Prothrombin Time: 14 seconds (ref 11.6–15.2)

## 2014-03-29 LAB — BASIC METABOLIC PANEL
Anion gap: 6 (ref 5–15)
BUN: 10 mg/dL (ref 6–23)
CO2: 29 mmol/L (ref 19–32)
Calcium: 8.9 mg/dL (ref 8.4–10.5)
Chloride: 98 mmol/L (ref 96–112)
Creatinine, Ser: 0.53 mg/dL (ref 0.50–1.10)
GFR calc non Af Amer: >90 mL/min (ref >90)
GLUCOSE: 122 mg/dL — AB (ref 70–99)
POTASSIUM: 3.7 mmol/L (ref 3.5–5.1)
Sodium: 133 mmol/L — ABNORMAL LOW (ref 135–145)

## 2014-03-29 LAB — APTT: aPTT: 30 seconds (ref 24–37)

## 2014-03-29 LAB — CBC
HCT: 31.6 % — ABNORMAL LOW (ref 36.0–46.0)
Hemoglobin: 10.2 g/dL — ABNORMAL LOW (ref 12.0–15.0)
MCH: 28.9 pg (ref 26.0–34.0)
MCHC: 32.3 g/dL (ref 30.0–36.0)
MCV: 89.5 fL (ref 78.0–100.0)
Platelets: 148 10*3/uL — ABNORMAL LOW (ref 150–400)
RBC: 3.53 MIL/uL — ABNORMAL LOW (ref 3.87–5.11)
RDW: 14 % (ref 11.5–15.5)
WBC: 4.8 10*3/uL (ref 4.0–10.5)

## 2014-03-29 MED ORDER — MIDAZOLAM HCL 2 MG/2ML IJ SOLN
INTRAMUSCULAR | Status: AC
  Filled 2014-03-29: qty 6

## 2014-03-29 MED ORDER — MIDAZOLAM HCL 2 MG/2ML IJ SOLN
INTRAMUSCULAR | Status: AC | PRN
  Administered 2014-03-29 (×3): 0.5 mg via INTRAVENOUS

## 2014-03-29 MED ORDER — FENTANYL CITRATE 0.05 MG/ML IJ SOLN
INTRAMUSCULAR | Status: AC | PRN
  Administered 2014-03-29 (×3): 25 ug via INTRAVENOUS

## 2014-03-29 MED ORDER — VANCOMYCIN HCL IN DEXTROSE 1-5 GM/200ML-% IV SOLN
1000.0000 mg | Freq: Once | INTRAVENOUS | Status: AC
  Administered 2014-03-29: 1000 mg via INTRAVENOUS
  Filled 2014-03-29: qty 200

## 2014-03-29 MED ORDER — LIDOCAINE-EPINEPHRINE 2 %-1:100000 IJ SOLN
INTRAMUSCULAR | Status: AC
  Filled 2014-03-29: qty 1

## 2014-03-29 MED ORDER — SODIUM CHLORIDE 0.9 % IV SOLN
Freq: Once | INTRAVENOUS | Status: AC
  Administered 2014-03-29: 10:00:00 via INTRAVENOUS

## 2014-03-29 MED ORDER — FENTANYL CITRATE 0.05 MG/ML IJ SOLN
INTRAMUSCULAR | Status: AC
  Filled 2014-03-29: qty 6

## 2014-03-29 MED ORDER — HEPARIN SOD (PORK) LOCK FLUSH 100 UNIT/ML IV SOLN
INTRAVENOUS | Status: AC
  Filled 2014-03-29: qty 5

## 2014-03-29 NOTE — Procedures (Signed)
Successful placement of right IJ approach port-a-cath with tip at the superior caval atrial junction. The catheter is ready for immediate use. No immediate post procedural complications. 

## 2014-03-29 NOTE — Discharge Instructions (Signed)
Implanted [facility] Guide  Leave dressing in place for 24 hours them may remove and shower . Call [facility] for any problems [phone] An implanted port is a type of central line that is placed under the skin. Central lines are used to provide IV access when treatment or nutrition needs to be given through a person's veins. Implanted ports are used for long-term IV access. An implanted port may be placed because:   You need IV medicine that would be irritating to the small veins in your hands or arms.   You need long-term IV medicines, such as antibiotics.   You need IV nutrition for a long period.   You need frequent blood draws for lab tests.   You need dialysis.  Implanted ports are usually placed in the chest area, but they can also be placed in the upper arm, the abdomen, or the leg. An implanted port has two main parts:   Reservoir. The reservoir is round and will appear as a small, raised area under your skin. The reservoir is the part where a needle is inserted to give medicines or draw blood.   Catheter. The catheter is a thin, flexible tube that extends from the reservoir. The catheter is placed into a large vein. Medicine that is inserted into the reservoir goes into the catheter and then into the vein.  HOW WILL I CARE FOR MY INCISION SITE? Do not get the incision site wet. Bathe or shower as directed by your health care provider.  HOW IS MY PORT ACCESSED? Special steps must be taken to access the port:   Before the port is accessed, a numbing cream can be placed on the skin. This helps numb the skin over the port site.   Your health care provider uses a sterile technique to access the port.  Your health care provider must put on a mask and sterile gloves.  The skin over your port is cleaned carefully with an antiseptic and allowed to dry.  The port is gently pinched between sterile gloves, and a needle is inserted into the port.  Only "non-coring"  port needles should be used to access the port. Once the port is accessed, a blood return should be checked. This helps ensure that the port is in the vein and is not clogged.   If your port needs to remain accessed for a constant infusion, a clear (transparent) bandage will be placed over the needle site. The bandage and needle will need to be changed every week, or as directed by your health care provider.   Keep the bandage covering the needle clean and dry. Do not get it wet. Follow your health care provider's instructions on how to take a shower or bath while the port is accessed.   If your port does not need to stay accessed, no bandage is needed over the port.  WHAT IS FLUSHING? Flushing helps keep the port from getting clogged. Follow your health care provider's instructions on how and when to flush the port. Ports are usually flushed with saline solution or a medicine called heparin. The need for flushing will depend on how the port is used.   If the port is used for intermittent medicines or blood draws, the port will need to be flushed:   After medicines have been given.   After blood has been drawn.   As part of routine maintenance.   If a constant infusion is running, the port may not need to  be flushed.  HOW LONG WILL MY PORT STAY IMPLANTED? The port can stay in for as long as your health care provider thinks it is needed. When it is time for the port to come out, surgery will be done to remove it. The procedure is similar to the one performed when the port was put in.  WHEN SHOULD I SEEK IMMEDIATE MEDICAL CARE? When you have an implanted port, you should seek immediate medical care if:   You notice a bad smell coming from the incision site.   You have swelling, redness, or drainage at the incision site.   You have more swelling or pain at the port site or the surrounding area.   You have a fever that is not controlled with medicine. Document Released:  02/08/2005 Document Revised: 11/29/2012 Document Reviewed: 10/16/2012 [facility] Patient Information 2015 [place], [place]. This information is not intended to replace advice given to you by your health care provider. Make sure you discuss any questions you have with your health care provider. Conscious Sedation, Adult, Care After Refer to this sheet in the next few weeks. These instructions provide you with information on caring for yourself after your procedure. Your health care provider may also give you more specific instructions. Your treatment has been planned according to current medical practices, but problems sometimes occur. Call your health care provider if you have any problems or questions after your procedure. WHAT TO EXPECT AFTER THE PROCEDURE  After your procedure:  You may feel sleepy, clumsy, and have poor balance for several hours.  Vomiting may occur if you eat too soon after the procedure. HOME CARE INSTRUCTIONS  Do not participate in any activities where you could become injured for at least 24 hours. Do not:  Drive.  Swim.  Ride a bicycle.  Operate heavy machinery.  [place].  Use power tools.  Climb ladders.  Work from a high place.  Do not make important decisions or sign legal documents until you are improved.  If you vomit, drink water, juice, or soup when you can drink without vomiting. Make sure you have little or no nausea before eating solid foods.  Only take over-the-counter or prescription medicines for pain, discomfort, or fever as directed by your health care provider.  Make sure you and your family fully understand everything about the medicines given to you, including what side effects may occur.  You should not drink alcohol, take sleeping pills, or take medicines that cause drowsiness for at least 24 hours.  If you smoke, do not smoke without supervision.  If you are feeling better, you may resume normal activities 24 hours after you were  sedated.  Keep all appointments with your health care provider. SEEK MEDICAL CARE IF:  Your skin is pale or bluish in color.  You continue to feel nauseous or vomit.  Your pain is getting worse and is not helped by medicine.  You have bleeding or swelling.  You are still sleepy or feeling clumsy after 24 hours. SEEK IMMEDIATE MEDICAL CARE IF:  You develop a rash.  You have difficulty breathing.  You develop any type of allergic problem.  You have a fever. MAKE SURE YOU:  Understand these instructions.  Will watch your condition.  Will get help right away if you are not doing well or get worse. Document Released: 11/29/2012 Document Reviewed: 11/29/2012 [facility] Patient Information 2015 [place], [place]. This information is not intended to replace advice given to you by your health care provider. Make sure  you discuss any questions you have with your health care provider.

## 2014-03-29 NOTE — H&P (Signed)
Chief Complaint: "I'm having a port a cath put in"  Referring Physician(s): Kefalas,Thomas S  History of Present Illness: Tanya Ellis is a 74 y.o. 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 She presents today for port a cath placement for chemotherapy.   Past Medical History  Diagnosis Date  . [facility] spotted fever   . Lyme disease   . Polio   . Measles   . Mumps   . GERD (gastroesophageal reflux disease)   . Sliding hiatal hernia   . Headache(784.0)   . Uveitis   . Blind right eye   . Colon polyp   . Renal disorder   . Pneumonia   . West Nile fever   . Calculus of gallbladder   . Diverticulosis of colon (without mention of hemorrhage)   . Osteoarthrosis   . Dermatophytosis of nail   . Synovial cyst of popliteal space   . Varicose veins   . Venous insufficiency   . Edema   . Carpal tunnel syndrome   . Disturbance of skin sensation   . Acute poliomyelitis   . Visual loss   . Gastric ulcer   . Vaginal bleeding 01/22/2014  . Colon cancer     mother in 76s  . Allergy     Past Surgical History  Procedure Laterality Date  . Back surgery    . Tonsillectomy    . Adenoidectomy    . I&d extremity  03/14/2011    Procedure: IRRIGATION AND DEBRIDEMENT EXTREMITY;  Surgeon: Tennis Must, MD;  Location: [place];  Service: Orthopedics;  Laterality: Right;  . Inguinal lymph node biopsy Right 02/13/14    Allergies: Cefuroxime and Sulfa antibiotics  Medications: Prior to Admission medications   Medication Sig Start Date End Date Taking? Authorizing Provider  ALPRAZolam Duanne Moron) 0.5 MG tablet Take 0.5 mg by mouth 3 (three) times daily as needed for anxiety.   Yes Historical Provider, MD  morphine (MS CONTIN) 30 MG 12 hr tablet Take 1 tablet (30 mg total) by  mouth every 12 (twelve) hours. 03/26/14  Yes Molli Hazard, MD  morphine (MSIR) 15 MG tablet Take 1 tablet (15 mg total) by mouth every 4 (four) hours as needed for severe pain. 03/26/14  Yes Molli Hazard, MD  prochlorperazine (COMPAZINE) 10 MG tablet Take 1 tablet (10 mg total) by mouth every 6 (six) hours as needed for nausea or vomiting. 03/25/14  Yes Molli Hazard, MD  Calcium Carb-Cholecalciferol (CALCIUM-VITAMIN D3) 600-500 MG-UNIT CAPS Take 1 capsule by mouth 2 (two) times daily. 03/25/14   Molli Hazard, MD  CARBOPLATIN IV Inject into the vein. Day 1, 8 every 21 days. To start 04/01/14    Historical Provider, MD  lidocaine-prilocaine (EMLA) cream Apply a quarter size amount to port site 1 hour prior to chemo. Do not rub in. Cover with plastic wrap. 03/25/14   Molli Hazard, MD  Loperamide HCl (LOPERAMIDE A-D PO) Take 1 tablet by mouth every 6 (six) hours as needed (loose stools).    Historical Provider, MD  omeprazole (PRILOSEC) 20 MG capsule Take 2 capsules (40 mg total) by mouth daily. 10/15/13   Merryl Hacker, MD  ondansetron (ZOFRAN) 8 MG tablet Take 1 tablet (8 mg total) by mouth every 8 (eight) hours as needed for nausea. 03/08/14  Lennis Marion Downer, MD  PACLitaxel (TAXOL IV) Inject into the vein. Day 1 & 8 every 21 days. To start 04/01/14    Historical Provider, MD  Zoledronic Acid (ZOMETA IV) Inject into the vein. To be given every 28 days    Historical Provider, MD    Family History  Problem Relation Age of Onset  . Coronary artery disease Father   . Heart disease Father     before age 24  . Diabetes Mother   . Cancer Mother   . Deafness Daughter   . Other Daughter     grand gresis  . Stroke Son   . Stroke Maternal Grandfather   . Stroke Paternal Grandmother     heat stroke  . Stroke Paternal Grandfather   . Cancer Sister   . Cancer Maternal Aunt   . Cancer Maternal Uncle   . Cancer Other   . Cancer Maternal Aunt     History     Social History  . Marital Status: Widowed    Spouse Name: N/A    Number of Children: 2  . Years of Education: N/A   Social History Main Topics  . Smoking status: Former Smoker    Types: Cigarettes  . Smokeless tobacco: Never Used  . Alcohol Use: No  . Drug Use: No  . Sexual Activity: No   Other Topics Concern  . Not on file   Social History Narrative      Review of Systems  Constitutional: Positive for fatigue. Negative for fever and chills.  Eyes:       Blind rt eye  Cardiovascular: Positive for leg swelling.       Occ mid chest "spasms" per pt ?related to HH/reflux disease  Gastrointestinal: Positive for nausea, vomiting and abdominal pain. Negative for blood in stool.       Rectal "fullness"  Genitourinary: Negative for dysuria and hematuria.       Prior hx vaginal bleeding  Musculoskeletal: Positive for back pain.  Neurological: Negative for headaches.    Vital Signs: BP 120/65 mmHg  Pulse 82  Resp 20  SpO2 97%  Physical Exam  Constitutional: She is oriented to person, place, and time.  Frail WF in NAD  Cardiovascular: Normal rate and regular rhythm.   Pulmonary/Chest: Effort normal.  Sl dim BS bases  Abdominal: Soft. Bowel sounds are normal. There is tenderness.  Musculoskeletal: Normal range of motion. She exhibits edema.  Neurological: She is alert and oriented to person, place, and time.    Imaging: No results found.  Labs:  CBC:  Recent Labs  02/15/14 1534 03/08/14 1446 03/22/14 0922 03/29/14 0900  WBC 5.8 5.2 7.1 4.8  HGB 11.0* 10.9* 10.7* 10.2*  HCT 34.5* 34.1* 33.1* 31.6*  PLT 165 107* 121* 148*    COAGS:  Recent Labs  08/12/13 1530 02/13/14 1140  INR 1.02 1.03  APTT  --  29    BMP:  Recent Labs  08/22/13 2011 10/15/13 1337 01/30/14 1459 02/15/14 1534 03/08/14 1446 03/22/14 0922  NA 140 137  --  135 138 133*  K 4.0 4.0  --  3.8 4.0 3.9  CL 102 101  --  103  --  102  CO2 26 25  --  26 26 26   GLUCOSE 119*  105*  --  122* 124 124*  BUN 9 10  --  8 9.0 11  CALCIUM 9.4 8.8  --  9.3 8.8 8.8  CREATININE 0.69 0.79 0.63 0.63 0.7  0.68  GFRNONAA 83* 79*  --  86*  --  83*  GFRAA >90 >90  --  >90  --  >90    LIVER FUNCTION TESTS:  Recent Labs  08/12/13 1455 02/15/14 1534 03/08/14 1446 03/22/14 0922  BILITOT 0.6 0.8 0.44 0.8  AST 17 21 15 19   ALT 11 12 10 13   ALKPHOS 75 97 101 82  PROT 6.2 7.2 6.2* 6.5  ALBUMIN 3.7 4.1 3.7 3.8    TUMOR MARKERS: No results for input(s): AFPTM, CEA, CA199, CHROMGRNA in the last 8760 hours.  Assessment and Plan: Tanya Ellis is a 74 y.o. 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 She presents today for port a cath placement for chemotherapy. Details/risks of procedure d/w pt/granddaughter with their understanding and consent.   Signed: Autumn Messing 03/29/2014, 9:45 AM

## 2014-04-01 ENCOUNTER — Observation Stay (HOSPITAL_COMMUNITY)
Admission: EM | Admit: 2014-04-01 | Discharge: 2014-04-02 | Disposition: A | Discharge status: Home / Self Care | Payer: Medicare Other | Attending: Internal Medicine | Admitting: Internal Medicine

## 2014-04-01 ENCOUNTER — Emergency Department (HOSPITAL_COMMUNITY): Payer: Medicare Other

## 2014-04-01 ENCOUNTER — Encounter (HOSPITAL_COMMUNITY): Payer: Self-pay

## 2014-04-01 ENCOUNTER — Ambulatory Visit (HOSPITAL_COMMUNITY): Payer: Medicare Other

## 2014-04-01 ENCOUNTER — Ambulatory Visit (HOSPITAL_COMMUNITY): Payer: Medicare Other | Admitting: Hematology & Oncology

## 2014-04-01 DIAGNOSIS — Z85038 Personal history of other malignant neoplasm of large intestine: Secondary | ICD-10-CM | POA: Insufficient documentation

## 2014-04-01 DIAGNOSIS — R5383 Other fatigue: Secondary | ICD-10-CM | POA: Diagnosis present

## 2014-04-01 DIAGNOSIS — Z87891 Personal history of nicotine dependence: Secondary | ICD-10-CM | POA: Insufficient documentation

## 2014-04-01 DIAGNOSIS — Z8612 Personal history of poliomyelitis: Secondary | ICD-10-CM | POA: Insufficient documentation

## 2014-04-01 DIAGNOSIS — H209 Unspecified iridocyclitis: Secondary | ICD-10-CM | POA: Insufficient documentation

## 2014-04-01 DIAGNOSIS — Z8701 Personal history of pneumonia (recurrent): Secondary | ICD-10-CM | POA: Diagnosis not present

## 2014-04-01 DIAGNOSIS — K219 Gastro-esophageal reflux disease without esophagitis: Secondary | ICD-10-CM | POA: Diagnosis not present

## 2014-04-01 DIAGNOSIS — R410 Disorientation, unspecified: Secondary | ICD-10-CM

## 2014-04-01 DIAGNOSIS — M7120 Synovial cyst of popliteal space [Baker], unspecified knee: Secondary | ICD-10-CM | POA: Insufficient documentation

## 2014-04-01 DIAGNOSIS — M1990 Unspecified osteoarthritis, unspecified site: Secondary | ICD-10-CM | POA: Diagnosis not present

## 2014-04-01 DIAGNOSIS — Z8619 Personal history of other infectious and parasitic diseases: Secondary | ICD-10-CM | POA: Diagnosis not present

## 2014-04-01 DIAGNOSIS — G5600 Carpal tunnel syndrome, unspecified upper limb: Secondary | ICD-10-CM | POA: Insufficient documentation

## 2014-04-01 DIAGNOSIS — C541 Malignant neoplasm of endometrium: Secondary | ICD-10-CM | POA: Diagnosis present

## 2014-04-01 DIAGNOSIS — I872 Venous insufficiency (chronic) (peripheral): Secondary | ICD-10-CM | POA: Insufficient documentation

## 2014-04-01 DIAGNOSIS — Z86010 Personal history of colonic polyps: Secondary | ICD-10-CM | POA: Diagnosis not present

## 2014-04-01 DIAGNOSIS — Z79899 Other long term (current) drug therapy: Secondary | ICD-10-CM | POA: Insufficient documentation

## 2014-04-01 DIAGNOSIS — Z8541 Personal history of malignant neoplasm of cervix uteri: Secondary | ICD-10-CM | POA: Diagnosis not present

## 2014-04-01 DIAGNOSIS — I868 Varicose veins of other specified sites: Secondary | ICD-10-CM | POA: Insufficient documentation

## 2014-04-01 DIAGNOSIS — R000 Tachycardia, unspecified: Secondary | ICD-10-CM | POA: Diagnosis not present

## 2014-04-01 DIAGNOSIS — R0602 Shortness of breath: Principal | ICD-10-CM | POA: Insufficient documentation

## 2014-04-01 DIAGNOSIS — K449 Diaphragmatic hernia without obstruction or gangrene: Secondary | ICD-10-CM | POA: Insufficient documentation

## 2014-04-01 DIAGNOSIS — C7951 Secondary malignant neoplasm of bone: Secondary | ICD-10-CM

## 2014-04-01 DIAGNOSIS — R600 Localized edema: Secondary | ICD-10-CM | POA: Insufficient documentation

## 2014-04-01 DIAGNOSIS — H5441 Blindness, right eye, normal vision left eye: Secondary | ICD-10-CM | POA: Insufficient documentation

## 2014-04-01 DIAGNOSIS — G9340 Encephalopathy, unspecified: Secondary | ICD-10-CM | POA: Diagnosis present

## 2014-04-01 LAB — COMPREHENSIVE METABOLIC PANEL
ALT: 11 U/L (ref 0–35)
AST: 20 U/L (ref 0–37)
Albumin: 3.1 g/dL — ABNORMAL LOW (ref 3.5–5.2)
Alkaline Phosphatase: 80 U/L (ref 39–117)
Anion gap: 3 — ABNORMAL LOW (ref 5–15)
BUN: 7 mg/dL (ref 6–23)
CALCIUM: 8.3 mg/dL — AB (ref 8.4–10.5)
CO2: 29 mmol/L (ref 19–32)
Chloride: 97 mmol/L (ref 96–112)
Creatinine, Ser: 0.62 mg/dL (ref 0.50–1.10)
GFR calc Af Amer: >90 mL/min (ref >90)
GFR calc non Af Amer: 86 mL/min — ABNORMAL LOW (ref >90)
Glucose, Bld: 121 mg/dL — ABNORMAL HIGH (ref 70–99)
Potassium: 3.6 mmol/L (ref 3.5–5.1)
Sodium: 129 mmol/L — ABNORMAL LOW (ref 135–145)
Total Bilirubin: 0.5 mg/dL (ref 0.3–1.2)
Total Protein: 5.9 g/dL — ABNORMAL LOW (ref 6.0–8.3)

## 2014-04-01 LAB — CBC WITH DIFFERENTIAL/PLATELET
Basophils Absolute: 0 10*3/uL (ref 0.0–0.1)
Basophils Relative: 0 % (ref 0–1)
Eosinophils Absolute: 0.2 10*3/uL (ref 0.0–0.7)
Eosinophils Relative: 3 % (ref 0–5)
HEMATOCRIT: 32.3 % — AB (ref 36.0–46.0)
Hemoglobin: 10.2 g/dL — ABNORMAL LOW (ref 12.0–15.0)
LYMPHS PCT: 10 % — AB (ref 12–46)
Lymphs Abs: 0.8 10*3/uL (ref 0.7–4.0)
MCH: 28.4 pg (ref 26.0–34.0)
MCHC: 31.6 g/dL (ref 30.0–36.0)
MCV: 90 fL (ref 78.0–100.0)
Monocytes Absolute: 0.7 10*3/uL (ref 0.1–1.0)
Monocytes Relative: 9 % (ref 3–12)
Neutro Abs: 5.7 10*3/uL (ref 1.7–7.7)
Neutrophils Relative %: 78 % — ABNORMAL HIGH (ref 43–77)
PLATELETS: 133 10*3/uL — AB (ref 150–400)
RBC: 3.59 MIL/uL — AB (ref 3.87–5.11)
RDW: 13.9 % (ref 11.5–15.5)
WBC: 7.4 10*3/uL (ref 4.0–10.5)

## 2014-04-01 LAB — I-STAT CG4 LACTIC ACID, ED: Lactic Acid, Venous: 0.65 mmol/L (ref 0.5–2.0)

## 2014-04-01 LAB — URINALYSIS, ROUTINE W REFLEX MICROSCOPIC
Bilirubin Urine: NEGATIVE
GLUCOSE, UA: NEGATIVE mg/dL
HGB URINE DIPSTICK: NEGATIVE
Leukocytes, UA: NEGATIVE
Nitrite: NEGATIVE
Protein, ur: NEGATIVE mg/dL
SPECIFIC GRAVITY, URINE: 1.015 (ref 1.005–1.030)
Urobilinogen, UA: 1 mg/dL (ref 0.0–1.0)
pH: 6 (ref 5.0–8.0)

## 2014-04-01 MED ORDER — MORPHINE SULFATE 15 MG PO TABS
15.0000 mg | ORAL_TABLET | ORAL | Status: DC | PRN
  Administered 2014-04-02: 15 mg via ORAL
  Filled 2014-04-01: qty 1

## 2014-04-01 MED ORDER — ACETAMINOPHEN 325 MG PO TABS: 650.0000 mg | ORAL_TABLET | Freq: Four times a day (QID) | ORAL | Status: DC | PRN

## 2014-04-01 MED ORDER — SODIUM CHLORIDE 0.9 % IV SOLN
INTRAVENOUS | Status: DC
  Administered 2014-04-01: 15:00:00 via INTRAVENOUS

## 2014-04-01 MED ORDER — ONDANSETRON HCL 4 MG/2ML IJ SOLN: 4.0000 mg | Freq: Four times a day (QID) | INTRAMUSCULAR | Status: DC | PRN

## 2014-04-01 MED ORDER — ONDANSETRON HCL 4 MG PO TABS: 4.0000 mg | ORAL_TABLET | Freq: Four times a day (QID) | ORAL | Status: DC | PRN

## 2014-04-01 MED ORDER — ALPRAZOLAM 0.5 MG PO TABS: 0.5000 mg | ORAL_TABLET | Freq: Three times a day (TID) | ORAL | Status: DC | PRN

## 2014-04-01 MED ORDER — ENOXAPARIN SODIUM 40 MG/0.4ML ~~LOC~~ SOLN
40.0000 mg | SUBCUTANEOUS | Status: DC
  Administered 2014-04-01: 40 mg via SUBCUTANEOUS
  Filled 2014-04-01: qty 0.4

## 2014-04-01 MED ORDER — SODIUM CHLORIDE 0.9 % IV SOLN: INTRAVENOUS | Status: DC

## 2014-04-01 MED ORDER — ACETAMINOPHEN 650 MG RE SUPP: 650.0000 mg | Freq: Four times a day (QID) | RECTAL | Status: DC | PRN

## 2014-04-01 MED ORDER — NALOXONE HCL 0.4 MG/ML IJ SOLN: 0.4000 mg | Freq: Once | INTRAMUSCULAR | Status: DC

## 2014-04-01 NOTE — ED Notes (Signed)
Complain of generalized weakness. Pt states she has cancer and was suppose to start her treatment here today.

## 2014-04-01 NOTE — ED Notes (Signed)
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 I have a lot of money and she wants it all. She tried to give me something before she left." Dr. Rogene Houston and Dr. Sarajane Jews aware.

## 2014-04-01 NOTE — ED Notes (Signed)
Report given to T. Linna Darner RN

## 2014-04-01 NOTE — ED Notes (Signed)
Patient off bedside commode; cleaned and placed back in bed.  Patient awaiting bed assignment.

## 2014-04-01 NOTE — Progress Notes (Signed)
2030

## 2014-04-01 NOTE — ED Notes (Signed)
Patient transported to 57 by Erroll Luna, RN and Jenna Luo, RN per [employer].   Rayann Heman, RN aware that report has not been given and that I am waiting for RN to call back for report.

## 2014-04-01 NOTE — ED Provider Notes (Signed)
CSN: [id number]     Arrival date & time 04/01/14  0848 History  This chart was scribed for Tanya Sorrow, MD by Stephania Fragmin, ED Scribe. This patient was seen in room APA01/APA01 and the patient's care was started at 9:03 AM.    Chief Complaint  Patient presents with  . Fatigue   The history is provided by the patient. No language interpreter was used.   LEVEL 5 CAVEAT DUE TO ALTERED MENTAL STATUS  HPI Comments: Tanya Ellis is a 74 y.o. female brought in by ambulance, who presents to the Emergency Department. Patient states she was supposed to start chemotherapy upstairs today (for Stage IV squamous cell cervical cancer, per medical records). She states she has been taking pain medication for chemotherapy. She denies home oxygen use. Patient lives with her son. Her granddaughter called EMS today.  PCP Dr. Darci Current, although patient mentions he is retiring.   9:36 AM - Granddaughter states that patient has had altered mental status for the past 2 days. This morning, patient took 15 mg of pain medication this morning, though she normally takes 30 mg daily. She reports that patient had a tooth extraction. She notes that her grandmother has been asking where her husband is, but he died in Jun 15, 2010.    Past Medical History  Diagnosis Date  . [facility] spotted fever   . Lyme disease   . Polio   . Measles   . Mumps   . GERD (gastroesophageal reflux disease)   . Sliding hiatal hernia   . Headache(784.0)   . Uveitis   . Blind right eye   . Colon polyp   . Renal disorder   . Pneumonia   . West Nile fever   . Calculus of gallbladder   . Diverticulosis of colon (without mention of hemorrhage)   . Osteoarthrosis   . Dermatophytosis of nail   . Synovial cyst of popliteal space   . Varicose veins   . Venous insufficiency   . Edema   . Carpal tunnel syndrome   . Disturbance of skin sensation   . Acute poliomyelitis   . Visual loss   . Gastric ulcer   . Vaginal bleeding 01/22/2014  . Colon  cancer     mother in 78s  . Allergy    Past Surgical History  Procedure Laterality Date  . Back surgery    . Tonsillectomy    . Adenoidectomy    . I&d extremity  03/14/2011    Procedure: IRRIGATION AND DEBRIDEMENT EXTREMITY;  Surgeon: Tennis Must, MD;  Location: [place];  Service: Orthopedics;  Laterality: Right;  . Inguinal lymph node biopsy Right 02/13/14   Family History  Problem Relation Age of Onset  . Coronary artery disease Father   . Heart disease Father     before age 34  . Diabetes Mother   . Cancer Mother   . Deafness Daughter   . Other Daughter     grand gresis  . Stroke Son   . Stroke Maternal Grandfather   . Stroke Paternal Grandmother     heat stroke  . Stroke Paternal Grandfather   . Cancer Sister   . Cancer Maternal Aunt   . Cancer Maternal Uncle   . Cancer Other   . Cancer Maternal Aunt    History  Substance Use Topics  . Smoking status: Former Smoker    Types: Cigarettes  . Smokeless tobacco: Never Used  . Alcohol Use: No   OB History  Gravida Para Term Preterm AB TAB SAB Ectopic Multiple Living   2 2 2       2      Review of Systems  Unable to perform ROS: Mental status change      Allergies  Cefuroxime and Sulfa antibiotics  Home Medications   Prior to Admission medications   Medication Sig Start Date End Date Taking? Authorizing Provider  ALPRAZolam Duanne Moron) 0.5 MG tablet Take 0.5 mg by mouth 3 (three) times daily as needed for anxiety.   Yes Historical Provider, MD  Calcium Carb-Cholecalciferol (CALCIUM-VITAMIN D3) 600-500 MG-UNIT CAPS Take 1 capsule by mouth 2 (two) times daily. 03/25/14  Yes Molli Hazard, MD  Loperamide HCl (LOPERAMIDE A-D PO) Take 1 tablet by mouth every 6 (six) hours as needed (loose stools).   Yes Historical Provider, MD  morphine (MS CONTIN) 15 MG 12 hr tablet Take 15 mg by mouth every 12 (twelve) hours.   Yes Historical Provider, MD  morphine (MSIR) 15 MG tablet Take 1 tablet (15 mg total) by mouth  every 4 (four) hours as needed for severe pain. 03/26/14  Yes Molli Hazard, MD  omeprazole (PRILOSEC) 20 MG capsule Take 2 capsules (40 mg total) by mouth daily. 10/15/13  Yes Merryl Hacker, MD  ondansetron (ZOFRAN) 8 MG tablet Take 1 tablet (8 mg total) by mouth every 8 (eight) hours as needed for nausea. 03/08/14  Yes Lennis Marion Downer, MD  CARBOPLATIN IV Inject into the vein. Day 1, 8 every 21 days. To start 04/01/14    Historical Provider, MD  lidocaine-prilocaine (EMLA) cream Apply a quarter size amount to port site 1 hour prior to chemo. Do not rub in. Cover with plastic wrap. 03/25/14   Molli Hazard, MD  morphine (MS CONTIN) 30 MG 12 hr tablet Take 1 tablet (30 mg total) by mouth every 12 (twelve) hours. Patient not taking: Reported on 04/01/2014 03/26/14   Molli Hazard, MD  PACLitaxel (TAXOL IV) Inject into the vein. Day 1 & 8 every 21 days. To start 04/01/14    Historical Provider, MD  prochlorperazine (COMPAZINE) 10 MG tablet Take 1 tablet (10 mg total) by mouth every 6 (six) hours as needed for nausea or vomiting. Patient not taking: Reported on 04/01/2014 03/25/14   Molli Hazard, MD  Zoledronic Acid (ZOMETA IV) Inject into the vein. To be given every 28 days    Historical Provider, MD   BP 110/62 mmHg  Pulse 91  Temp(Src) 98.3 F (36.8 C) (Oral)  Resp 6  Ht 5\' 3"  (1.6 m)  Wt 153 lb (69.4 kg)  BMI 27.11 kg/m2  SpO2 100% Physical Exam  Constitutional: She is oriented to person, place, and time. She appears well-developed and well-nourished. No distress.  HENT:  Head: Normocephalic and atraumatic.  Mouth/Throat: Oropharynx is clear and moist.  Pupils are pinpoint.   Eyes: Conjunctivae and EOM are normal. No scleral icterus.  Sclera is clear.  Neck: Neck supple. No tracheal deviation present.  Cardiovascular: Regular rhythm.  Tachycardia present.   Mild tachycardia.  Pulmonary/Chest: Effort normal and breath sounds normal. No respiratory distress.  She has no wheezes. She has no rales.  Dressing over a [name] in her left chest. Bruising on the right breast.  Abdominal: Soft. Bowel sounds are normal. There is no tenderness.  Musculoskeletal: Normal range of motion. She exhibits edema (Trace pitting pedal edema.).  Neurological: She is alert and oriented to person, place, and time.  Skin: Skin is  warm and dry.  Psychiatric: She has a normal mood and affect. Her behavior is normal.  Nursing note and vitals reviewed.   ED Course  Procedures (including critical care time)  DIAGNOSTIC STUDIES: Oxygen Saturation is 91% on room air, adequate by my interpretation.    COORDINATION OF CARE: 9:34 AM-Discussed treatment plan which includes administration of Narcan with pt's granddaughter at bedside and pt's granddaughter agreed to plan.     Labs Review Labs Reviewed  CBC WITH DIFFERENTIAL/PLATELET - Abnormal; Notable for the following:    RBC 3.59 (*)    Hemoglobin 10.2 (*)    HCT 32.3 (*)    Platelets 133 (*)    Neutrophils Relative % 78 (*)    Lymphocytes Relative 10 (*)    All other components within normal limits  COMPREHENSIVE METABOLIC PANEL - Abnormal; Notable for the following:    Sodium 129 (*)    Glucose, Bld 121 (*)    Calcium 8.3 (*)    Total Protein 5.9 (*)    Albumin 3.1 (*)    GFR calc non Af Amer 86 (*)    Anion gap 3 (*)    All other components within normal limits  URINALYSIS, ROUTINE W REFLEX MICROSCOPIC - Abnormal; Notable for the following:    Ketones, ur TRACE (*)    All other components within normal limits  CULTURE, BLOOD (ROUTINE X 2)  CULTURE, BLOOD (ROUTINE X 2)  I-STAT CG4 LACTIC ACID, ED   Results for orders placed or performed during the hospital encounter of 04/01/14  Culture, blood (routine x 2)  Result Value Ref Range   Specimen Description BLOOD    Special Requests Immunocompromised    Culture NO GROWTH <24 HRS    Report Status PENDING   Culture, blood (routine x 2)  Result Value  Ref Range   Specimen Description BLOOD    Special Requests Immunocompromised    Culture NO GROWTH <24 HRS    Report Status PENDING   CBC with Differential/Platelet  Result Value Ref Range   WBC 7.4 4.0 - 10.5 K/uL   RBC 3.59 (L) 3.87 - 5.11 MIL/uL   Hemoglobin 10.2 (L) 12.0 - 15.0 g/dL   HCT 32.3 (L) 36.0 - 46.0 %   MCV 90.0 78.0 - 100.0 fL   MCH 28.4 26.0 - 34.0 pg   MCHC 31.6 30.0 - 36.0 g/dL   RDW 13.9 11.5 - 15.5 %   Platelets 133 (L) 150 - 400 K/uL   Neutrophils Relative % 78 (H) 43 - 77 %   Neutro Abs 5.7 1.7 - 7.7 K/uL   Lymphocytes Relative 10 (L) 12 - 46 %   Lymphs Abs 0.8 0.7 - 4.0 K/uL   Monocytes Relative 9 3 - 12 %   Monocytes Absolute 0.7 0.1 - 1.0 K/uL   Eosinophils Relative 3 0 - 5 %   Eosinophils Absolute 0.2 0.0 - 0.7 K/uL   Basophils Relative 0 0 - 1 %   Basophils Absolute 0.0 0.0 - 0.1 K/uL  Comprehensive metabolic panel  Result Value Ref Range   Sodium 129 (L) 135 - 145 mmol/L   Potassium 3.6 3.5 - 5.1 mmol/L   Chloride 97 96 - 112 mmol/L   CO2 29 19 - 32 mmol/L   Glucose, Bld 121 (H) 70 - 99 mg/dL   BUN 7 6 - 23 mg/dL   Creatinine, Ser 0.62 0.50 - 1.10 mg/dL   Calcium 8.3 (L) 8.4 - 10.5 mg/dL   Total  Protein 5.9 (L) 6.0 - 8.3 g/dL   Albumin 3.1 (L) 3.5 - 5.2 g/dL   AST 20 0 - 37 U/L   ALT 11 0 - 35 U/L   Alkaline Phosphatase 80 39 - 117 U/L   Total Bilirubin 0.5 0.3 - 1.2 mg/dL   GFR calc non Af Amer 86 (L) >90 mL/min   GFR calc Af Amer >90 >90 mL/min   Anion gap 3 (L) 5 - 15  Urinalysis, Routine w reflex microscopic  Result Value Ref Range   Color, Urine YELLOW YELLOW   APPearance CLEAR CLEAR   Specific Gravity, Urine 1.015 1.005 - 1.030   pH 6.0 5.0 - 8.0   Glucose, UA NEGATIVE NEGATIVE mg/dL   Hgb urine dipstick NEGATIVE NEGATIVE   Bilirubin Urine NEGATIVE NEGATIVE   Ketones, ur TRACE (A) NEGATIVE mg/dL   Protein, ur NEGATIVE NEGATIVE mg/dL   Urobilinogen, UA 1.0 0.0 - 1.0 mg/dL   Nitrite NEGATIVE NEGATIVE   Leukocytes, UA NEGATIVE  NEGATIVE  I-Stat CG4 Lactic Acid, ED  Result Value Ref Range   Lactic Acid, Venous 0.65 0.5 - 2.0 mmol/L     Imaging Review Ct Head Wo Contrast  04/01/2014   CLINICAL DATA:  Altered mental status for 2 days.  EXAM: CT HEAD WITHOUT CONTRAST  TECHNIQUE: Contiguous axial images were obtained from the base of the skull through the vertex without intravenous contrast.  COMPARISON:  08/12/2013  FINDINGS: Skull and Sinuses:Negative for fracture or destructive process. There is mild scattered paranasal sinus mucosal thickening but no sinus effusion.  Orbits: No acute abnormality.  Brain: No evidence of acute infarction, hemorrhage, hydrocephalus, or mass lesion/mass effect. Mild cerebral volume loss which is likely age related.  IMPRESSION: Stable, negative head CT.   Electronically Signed   By: Jorje Guild M.D.   On: 04/01/2014 10:53   Dg Chest Port 1 View  04/01/2014   CLINICAL DATA:  Ovarian cancer.  Confusion.  EXAM: PORTABLE CHEST - 1 VIEW  COMPARISON:  10/15/2013  FINDINGS: Right [name] is in place with the tip in the SVC. Heart is normal size. Linear atelectasis in the right lung base. Left lung is clear. No effusions. No acute bony abnormality.  IMPRESSION: Right basilar atelectasis.   Electronically Signed   By: Rolm Baptise M.D.   On: 04/01/2014 09:40     EKG Interpretation   Date/Time:  Monday April 01 2014 08:54:58 EST Ventricular Rate:  108 PR Interval:    QRS Duration: 82 QT Interval:  340 QTC Calculation: 456 R Axis:   36 Text Interpretation:  Junctional tachycardia Low voltage, precordial leads  Artifact in lead(s) I II III aVR aVL aVF V1 Confirmed by Tajah Schreiner  MD,  Siddharth Babington ([id number]) on 04/01/2014 9:11:38 AM       Medications  0.9 %  sodium chloride infusion (not administered)  naloxone (NARCAN) injection 0.4 mg (not administered)    MDM   Final diagnoses:  SOB (shortness of breath)  Confusion   Patient with history of metastatic stage IV cervical cancer.  Patient brought in by granddaughter and EMS for overall weakness and confusion. Clinically it appears it could be related to her pain medicine. She still is confused. Workup without any significant findings. No evidence of pneumonia no evidence of urinary tract infection. Head CT negative. Mild hyponatremia with a sodium of 129. Patient not reversed with Narcan because she does have chronic pain. Suspect she may have taken too much of her pain medicine. No evidence  of any significant renal insufficiency. Patient will require admission and observation.  I personally performed the services described in this documentation, which was scribed in my presence. The recorded information has been reviewed and is accurate.    Tanya Sorrow, MD 04/01/14 [phone]

## 2014-04-01 NOTE — Progress Notes (Unsigned)
Zometa will be held today per MD request.   Patient had tooth extraction last week.

## 2014-04-01 NOTE — ED Notes (Signed)
Pt concerned that she may have misplaced an envelope with an unknown amount of money in it. Per Elliot Cousin and Virgina Jock, pt did not have an envelope or belongings with her when she arrived via EMS. RN called c-com to request to speak to medic who transported pt to facility. Awaiting return call.

## 2014-04-01 NOTE — ED Notes (Signed)
2 small oval shaped sacral pressure ulcers noted to midline sacrum with surrounding blanchable erythema.

## 2014-04-01 NOTE — H&P (Signed)
History and Physical  Tanya Ellis NFA:[id number] DOB: [birth year] DOA: 04/01/2014  Referring physician: Dr. Rogene Houston in ED PCP: Robert Bellow, MD   Chief Complaint: confused  HPI:  74 year old woman with metastatic endometrial cancer on long-acting and breakthrough pain medication who presented to the emergency department with a 48 hour history of increasing confusion with hallucinations and generalized weakness. Initial investigation revealed modest hyponatremia and imaging and ancillary studies were unremarkable. Patient was planned for observation overnight and further investigation as below.  Patient's been on narcotics for the last month without change including long-acting morphine 15 mg every 12 as well as short acting morphine for breakthrough. For the last month or so her daughter has noted very intermittent and transient confusion usually at night. However over the last 48 hours the patient's confusion has been persistent and worsening and characterized by hallucinations. Her granddaughter who has been caring for the patient since [name] insertion 3 days ago was worried that this was pain medication induced. Today the patient had generalized weakness and could hardly stand by herself. Per family the patient is very physically fit and this is quite a change.  In the emergency department afebrile, VSS, no hypoxia; imaging CT head, CXR unremarkable. EKG SR, no acute changes. Blood work revealed modest hyponatremia, urinalysis was unremarkable.  Review of Systems:  Negative for fever, visual changes, sore throat, rash, new muscle aches, chest pain, SOB, dysuria, bleeding, n/v/abdominal pain.  Past Medical History  Diagnosis Date  . [facility] spotted fever   . Lyme disease   . Polio   . Measles   . Mumps   . GERD (gastroesophageal reflux disease)   . Sliding hiatal hernia   . Headache(784.0)   . Uveitis   . Blind right eye   . Colon polyp   . Renal disorder   .  Pneumonia   . West Nile fever   . Calculus of gallbladder   . Diverticulosis of colon (without mention of hemorrhage)   . Osteoarthrosis   . Dermatophytosis of nail   . Synovial cyst of popliteal space   . Varicose veins   . Venous insufficiency   . Edema   . Carpal tunnel syndrome   . Disturbance of skin sensation   . Acute poliomyelitis   . Visual loss   . Gastric ulcer   . Vaginal bleeding 01/22/2014  . Colon cancer     mother in 32s  . Allergy     Past Surgical History  Procedure Laterality Date  . Back surgery    . Tonsillectomy    . Adenoidectomy    . I&d extremity  03/14/2011    Procedure: IRRIGATION AND DEBRIDEMENT EXTREMITY;  Surgeon: Tennis Must, MD;  Location: [place];  Service: Orthopedics;  Laterality: Right;  . Inguinal lymph node biopsy Right 02/13/14    Social History:  reports that she has quit smoking. Her smoking use included Cigarettes. She has never used smokeless tobacco. She reports that she does not drink alcohol or use illicit drugs.  Allergies  Allergen Reactions  . Cefuroxime Itching  . Sulfa Antibiotics Rash    Family History  Problem Relation Age of Onset  . Coronary artery disease Father   . Heart disease Father     before age 75  . Diabetes Mother   . Cancer Mother   . Deafness Daughter   . Other Daughter     grand gresis  . Stroke Son   . Stroke Maternal Grandfather   .  Stroke Paternal Grandmother     heat stroke  . Stroke Paternal Grandfather   . Cancer Sister   . Cancer Maternal Aunt   . Cancer Maternal Uncle   . Cancer Other   . Cancer Maternal Aunt      Prior to Admission medications   Medication Sig Start Date End Date Taking? Authorizing Provider  ALPRAZolam Duanne Moron) 0.5 MG tablet Take 0.5 mg by mouth 3 (three) times daily as needed for anxiety.   Yes Historical Provider, MD  Calcium Carb-Cholecalciferol (CALCIUM-VITAMIN D3) 600-500 MG-UNIT CAPS Take 1 capsule by mouth 2 (two) times daily. 03/25/14  Yes Molli Hazard, MD  Loperamide HCl (LOPERAMIDE A-D PO) Take 1 tablet by mouth every 6 (six) hours as needed (loose stools).   Yes Historical Provider, MD  morphine (MS CONTIN) 15 MG 12 hr tablet Take 15 mg by mouth every 12 (twelve) hours.   Yes Historical Provider, MD  morphine (MSIR) 15 MG tablet Take 1 tablet (15 mg total) by mouth every 4 (four) hours as needed for severe pain. 03/26/14  Yes Molli Hazard, MD  omeprazole (PRILOSEC) 20 MG capsule Take 2 capsules (40 mg total) by mouth daily. 10/15/13  Yes Merryl Hacker, MD  ondansetron (ZOFRAN) 8 MG tablet Take 1 tablet (8 mg total) by mouth every 8 (eight) hours as needed for nausea. 03/08/14  Yes Lennis Marion Downer, MD  CARBOPLATIN IV Inject into the vein. Day 1, 8 every 21 days. To start 04/01/14    Historical Provider, MD  lidocaine-prilocaine (EMLA) cream Apply a quarter size amount to port site 1 hour prior to chemo. Do not rub in. Cover with plastic wrap. 03/25/14   Molli Hazard, MD  morphine (MS CONTIN) 30 MG 12 hr tablet Take 1 tablet (30 mg total) by mouth every 12 (twelve) hours. Patient not taking: Reported on 04/01/2014 03/26/14   Molli Hazard, MD  PACLitaxel (TAXOL IV) Inject into the vein. Day 1 & 8 every 21 days. To start 04/01/14    Historical Provider, MD  prochlorperazine (COMPAZINE) 10 MG tablet Take 1 tablet (10 mg total) by mouth every 6 (six) hours as needed for nausea or vomiting. Patient not taking: Reported on 04/01/2014 03/25/14   Molli Hazard, MD  Zoledronic Acid (ZOMETA IV) Inject into the vein. To be given every 28 days    Historical Provider, MD   Physical Exam: Filed Vitals:   04/01/14 0858 04/01/14 0900 04/01/14 0930 04/01/14 1000  BP: 127/64 123/64 116/65 110/62  Pulse: 111 103 96 91  Temp: 98.3 F (36.8 C)     TempSrc: Oral     Resp: 18 11 9 6   Height: 5\' 3"  (1.6 m)     Weight: 69.4 kg (153 lb)     SpO2: 91% 100% 100% 100%   General: Examined in the emergency department. Appears  calm and comfortable Eyes: Pupils, irises, lids appear unremarkable ENT: grossly normal hearing, lips & tongue Cardiovascular: RRR, no m/r/g. 2+ bilateral LE edema. Per family this is long-standing. Respiratory: CTA bilaterally, no w/r/r. Normal respiratory effort. Abdomen: soft, ntnd Skin: no rash or induration seen by chart notable for sacral ulcers Musculoskeletal: grossly normal tone BUE/BLE, excellent strength bilateral upper and lower extremities Psychiatric: grossly normal mood and affect, speech fluent and appropriate. Generally alert and oriented to current situation. Neurologic: grossly non-focal.  Wt Readings from Last 3 Encounters:  04/01/14 69.4 kg (153 lb)  03/22/14 69.627 kg (153 lb 8 oz)  03/12/14 70.081 kg (154 lb 8 oz)    Labs on Admission:  Basic Metabolic Panel:  Recent Labs Lab 03/29/14 0900 04/01/14 0945  NA 133* 129*  K 3.7 3.6  CL 98 97  CO2 29 29  GLUCOSE 122* 121*  BUN 10 7  CREATININE 0.53 0.62  CALCIUM 8.9 8.3*    Liver Function Tests:  Recent Labs Lab 04/01/14 0945  AST 20  ALT 11  ALKPHOS 80  BILITOT 0.5  PROT 5.9*  ALBUMIN 3.1*   CBC:  Recent Labs Lab 03/29/14 0900 04/01/14 0945  WBC 4.8 7.4  NEUTROABS  --  5.7  HGB 10.2* 10.2*  HCT 31.6* 32.3*  MCV 89.5 90.0  PLT 148* 133*    Radiological Exams on Admission: Ct Head Wo Contrast  04/01/2014   CLINICAL DATA:  Altered mental status for 2 days.  EXAM: CT HEAD WITHOUT CONTRAST  TECHNIQUE: Contiguous axial images were obtained from the base of the skull through the vertex without intravenous contrast.  COMPARISON:  08/12/2013  FINDINGS: Skull and Sinuses:Negative for fracture or destructive process. There is mild scattered paranasal sinus mucosal thickening but no sinus effusion.  Orbits: No acute abnormality.  Brain: No evidence of acute infarction, hemorrhage, hydrocephalus, or mass lesion/mass effect. Mild cerebral volume loss which is likely age related.  IMPRESSION: Stable,  negative head CT.   Electronically Signed   By: Jorje Guild M.D.   On: 04/01/2014 10:53   Dg Chest Port 1 View  04/01/2014   CLINICAL DATA:  Ovarian cancer.  Confusion.  EXAM: PORTABLE CHEST - 1 VIEW  COMPARISON:  10/15/2013  FINDINGS: Right [name] is in place with the tip in the SVC. Heart is normal size. Linear atelectasis in the right lung base. Left lung is clear. No effusions. No acute bony abnormality.  IMPRESSION: Right basilar atelectasis.   Electronically Signed   By: Rolm Baptise M.D.   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 Acute encephalopathy with hallucinations, modest. Suspect narcotics given history. Normal WBC, normal lactic acid, normal u/a, afebrile, VSS, no acute findings on head CT, CXR clear. No evidence of infection or acute CNS process. She has good recall of recent events.  2. Stage IV endometrial cancer, h/o palliative radiation, plans for chemotherapy. 3. 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 4. Generalized weakness. No focal neurologic deficits. Excellent strength bilateral lower extremities, sensation grossly intact 5. Sacral pressure ulcers present on admission   Plan observation to the medical floor.   Gentle IV fluids, repeat basic metabolic panel in the morning  Physical therapy consultation  Will notify oncology of admission--d/w Dr. Whitney Muse, will hold long-acting morphine  Code Status: full code DVT prophylaxis: Lovenox Family Communication: discussed with daughter and granddaughter at bedside Disposition Plan/Anticipated LOS: obs, 24 hours  Time spent: 68 minutes  Murray Hodgkins, MD  [facility] Pager [phone] 04/01/2014, 12:04 PM

## 2014-04-02 ENCOUNTER — Encounter: Payer: Self-pay | Admitting: *Deleted

## 2014-04-02 DIAGNOSIS — R0602 Shortness of breath: Secondary | ICD-10-CM | POA: Diagnosis not present

## 2014-04-02 LAB — BASIC METABOLIC PANEL
ANION GAP: 3 — AB (ref 5–15)
BUN: 5 mg/dL — ABNORMAL LOW (ref 6–23)
CALCIUM: 8.5 mg/dL (ref 8.4–10.5)
CO2: 29 mmol/L (ref 19–32)
Chloride: 105 mmol/L (ref 96–112)
Creatinine, Ser: 0.46 mg/dL — ABNORMAL LOW (ref 0.50–1.10)
GFR calc Af Amer: >90 mL/min (ref >90)
GFR calc non Af Amer: >90 mL/min (ref >90)
GLUCOSE: 104 mg/dL — AB (ref 70–99)
POTASSIUM: 4 mmol/L (ref 3.5–5.1)
SODIUM: 137 mmol/L (ref 135–145)

## 2014-04-02 LAB — CBC
HCT: 28.7 % — ABNORMAL LOW (ref 36.0–46.0)
HEMOGLOBIN: 9.1 g/dL — AB (ref 12.0–15.0)
MCH: 28.4 pg (ref 26.0–34.0)
MCHC: 31.7 g/dL (ref 30.0–36.0)
MCV: 89.7 fL (ref 78.0–100.0)
Platelets: 125 10*3/uL — ABNORMAL LOW (ref 150–400)
RBC: 3.2 MIL/uL — ABNORMAL LOW (ref 3.87–5.11)
RDW: 14 % (ref 11.5–15.5)
WBC: 4 10*3/uL (ref 4.0–10.5)

## 2014-04-02 MED ORDER — HYDROCODONE-ACETAMINOPHEN 5-325 MG PO TABS: 1.0000 | ORAL_TABLET | Freq: Four times a day (QID) | ORAL | Status: DC | PRN

## 2014-04-02 NOTE — Progress Notes (Signed)
[facility] Clinical Social Work  Clinical Social Work was referred by admission to inpt for assessment of psychosocial needs.  Clinical Social Worker met with patient and granddaughter in the hospital to offer support and assess for needs.  Pt shared she was eager to go home with her family today. CSW has worked with her on several encounters and will continue to follow through her visits at [facility]. Pt has a variety of support people involved to assist her including, family, friends, church friends, etc. Granddaughter, Elmyra Ricks appears to be a positive support for pt and brings her to appointments, helps manage her care. No new concerns shared today by pt or family, both appeared in positive spirits today. Pt and granddaughter are aware of how to contact CSW at [facility] as needed.  Clinical Social Work interventions: Reassess needs Offer support  Loren Racer, [place] Tuesdays 8:30-1pm Wednesdays 8:30-12pm  [phone]

## 2014-04-02 NOTE — Clinical Social Work Psychosocial (Signed)
Clinical Social Work Department BRIEF PSYCHOSOCIAL ASSESSMENT 04/02/2014  Patient:  Tanya Ellis, Tanya Ellis     Account Number:  [id number]     Admit date:  04/01/2014  Clinical Social Worker:  Wyatt Haste  Date/Time:  04/02/2014 11:38 AM  Referred by:  Physician  Date Referred:  04/02/2014 Referred for  Other - See comment   Other Referral:   "ethical issues"   Interview type:  Patient Other interview type:   daughter- Manuela Schwartz  granddaughter- Elmyra Ricks    PSYCHOSOCIAL DATA Living Status:  FAMILY Admitted from facility:   Level of care:   Primary support name:  Elmyra Ricks Primary support relationship to patient:  FAMILY Degree of support available:   supportive    CURRENT CONCERNS Current Concerns  Other - See comment   Other Concerns:    SOCIAL WORK ASSESSMENT / PLAN CSW met with pt and pt's daughter, Manuela Schwartz and granddaughter, Elmyra Ricks at bedside. Pt alert and oriented x3. Pt admitted yesterday with acute encephalopathy with hallucinations. She has stage IV endometrial cancer and was supposed to start chemotherapy yesterday. After several days of confusion, family called EMS for evaluation at ED. Pt made several statements yesterday involving a misplaced envelope with money it, but no envelope was with pt when she came to ED. She also claimed that her daughter was giving her medications so she could have the farm. CSW received consult for ethical issues. Pt states that everything is going "great" at home and has no concerns. She is d/c today and is very eager to leave. She states she has a home health RN already and after PT evaluation, is requesting outpatient PT. Pt has transportation to all appointments with family. Elmyra Ricks states that pt was living with her for a short while, but when she leaves hospital, she will be returning to live with Manuela Schwartz. Elmyra Ricks will remain active in her care and be with pt during the day. No concerns noted by pt or family. CSW spoke with Loren Racer, [place] CSW,  who has been involved. She plans to see pt before she leaves today and will follow up in outpatient setting.   Assessment/plan status:  Referral to [employer] Other assessment/ plan:   Information/referral to community resources:   CM for outpatient therapy    PATIENT'S/FAMILY'S RESPONSE TO PLAN OF CARE: Pt did not report any concerns at home. She is happy to be d/c today. No similar statements were made today, and pt's mental status appears to be improved.       Benay Pike, [place]

## 2014-04-02 NOTE — Progress Notes (Signed)
UR completed 

## 2014-04-02 NOTE — Discharge Summary (Signed)
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 Initial investigation revealed modest hyponatremia and imaging and ancillary studies were unremarkable. Patient was planned for observation overnight and further investigation as below.  Patient's been on narcotics for the last month without change including long-acting morphine 15 mg every 12 as well as short acting morphine for breakthrough. For the last month or so her daughter has noted very intermittent and transient confusion usually at night.  -In the emergency department afebrile, VSS, no hypoxia; imaging CT head, CXR unremarkable. EKG SR, no acute changes. Blood work revealed modest hyponatremia, urinalysis was unremarkable.  Hospital Course:  1. Acute encephalopathy likely due to opioid+benzo overdose; neuro exam non focal; CT head: no acute findings; encephalopathy status resolved, at baseline; no s/s of infection, CXR, UA unremarkable; blood cultures: NGTD day 1, patient is afebrile  -d/w patient, her daughter, granddaughter at  The bedside; apparently, encephaloapthy happened after starting scheduled morphine MS, patient did not have any mental status  changes while on hydrocodone in the past -will d/s scheduled morphine, start prn hydrocodone, adjust titrate as needed as outpatient; will also hold prn xanax   2. Generalized weakness, neuro exam no focal; PT evaluated, family preferred to have outpatient PT  3. Stage IV endometrial cancer, h/o palliative radiation, plans for chemotherapy. Per oncology as outpatient   Procedures:  none (i.e. Studies not automatically included, echos, thoracentesis, etc; not x-rays)  Consultations:  none  Discharge Exam: Filed Vitals:   04/02/14 0618  BP: 143/70  Pulse: 102  Temp: 98.4 F (36.9 C)  Resp: 20    General: alert Cardiovascular: s1,s2 rrr Respiratory: CTA BL  Discharge Instructions  Discharge Instructions    Diet - low sodium heart healthy    Complete by:  As directed      Discharge instructions    Complete by:  As directed   Please follow up with primary care doctor in 1 week     Increase activity slowly    Complete by:  As directed             Medication List    STOP taking these medications        ALPRAZolam 0.5 MG tablet  Commonly known as:  XANAX     LOPERAMIDE A-D PO     morphine 15 MG 12 hr tablet  Commonly known as:  MS CONTIN     morphine 15 MG tablet  Commonly known as:  MSIR     morphine 30 MG 12 hr tablet  Commonly known as:  MS CONTIN     prochlorperazine 10 MG tablet  Commonly known as:  COMPAZINE      TAKE these medications        Calcium-Vitamin D3 600-500 MG-UNIT Caps  Take 1 capsule by mouth 2 (two) times daily.     CARBOPLATIN IV  Inject into the vein. Day 1, 8 every 21 days. To start 04/01/14     HYDROcodone-acetaminophen 5-325 MG per tablet  Commonly known as:  NORCO/VICODIN  Take 1 tablet by mouth every 6 (six) hours as needed for moderate pain.     lidocaine-prilocaine cream  Commonly known as:  EMLA  Apply a quarter size amount to port site 1 hour prior to chemo. Do not rub in. Cover with plastic wrap.     omeprazole 20  MG capsule  Commonly known as:  PRILOSEC  Take 2 capsules (40 mg total) by mouth daily.     ondansetron 8 MG tablet  Commonly known as:  ZOFRAN  Take 1 tablet (8 mg total) by mouth every 8 (eight) hours as needed for nausea.     TAXOL IV  Inject into the vein. Day 1 & 8 every 21 days. To start 04/01/14     ZOMETA IV  Inject into the vein. To be given every 28 days       Allergies  Allergen Reactions  . Cefuroxime Itching  . Sulfa Antibiotics Rash       Follow-up Information    Follow up with [facility].   Specialty:  Rehabilitation   Why:  Rehab will contact you to schedule first appointment.    Contact information:   [address] [id number] [place] [phone]       The results of significant diagnostics from this hospitalization (including imaging, microbiology, ancillary and laboratory) are listed below for reference.    Significant Diagnostic Studies: Ct Head Wo Contrast  04/01/2014   CLINICAL DATA:  Altered mental status for 2 days.  EXAM: CT HEAD WITHOUT CONTRAST  TECHNIQUE: Contiguous axial images were obtained from the base of the skull through the vertex without intravenous contrast.  COMPARISON:  08/12/2013  FINDINGS: Skull and Sinuses:Negative for fracture or destructive process. There is mild scattered paranasal sinus mucosal thickening but no sinus effusion.  Orbits: No acute abnormality.  Brain: No evidence of acute infarction, hemorrhage, hydrocephalus, or mass lesion/mass effect. Mild cerebral volume loss which is likely age related.  IMPRESSION: Stable, negative head CT.   Electronically Signed   By: Jorje Guild M.D.   On: 04/01/2014 10:53   Ir Fluoro Guide Cv Line Right  03/29/2014   INDICATION: History of metastatic squamous cell carcinoma to the bone. In need of durable intravenous access for chemotherapy administration.  EXAM: IMPLANTED PORT A CATH PLACEMENT WITH ULTRASOUND AND  FLUOROSCOPIC GUIDANCE  COMPARISON:  None.  MEDICATIONS: Vancomycin 1 gm IV; The antibiotic was administered within an appropriate time interval prior to skin puncture.  ANESTHESIA/SEDATION: Versed 1.5 mg IV; Fentanyl 75 mcg IV;  Total Moderate Sedation Time  23 minutes.  CONTRAST:  None  FLUOROSCOPY TIME:  18 seconds (3 mGy).  COMPLICATIONS: None immediate  PROCEDURE: The procedure, risks, benefits, and alternatives were explained to the patient. Questions regarding the procedure were encouraged and answered. The patient understands and consents to the procedure.  The right neck and chest were prepped with chlorhexidine in a sterile fashion, and a sterile drape was applied covering the operative field. Maximum barrier sterile technique with sterile gowns and gloves were used for the procedure. A timeout was performed prior to the initiation of the procedure. Local anesthesia was provided with 1% lidocaine with epinephrine.  After creating  a small venotomy incision, a micropuncture kit was utilized to access the internal jugular vein under direct, real-time ultrasound guidance. Ultrasound image documentation was performed. The microwire was kinked to measure appropriate catheter length.  A subcutaneous port pocket was then created along the upper chest wall utilizing a combination of sharp and blunt dissection. The pocket was irrigated with sterile saline. A single lumen thin power injectable port was chosen for placement. The 8 Fr catheter was tunneled from the port pocket site to the venotomy incision. The port was placed in the pocket. The external catheter was trimmed to appropriate length. At the venotomy, an 8 Fr peel-away sheath was placed over a guidewire under fluoroscopic guidance. The catheter was then placed through the sheath and the sheath was removed. Final catheter positioning was confirmed and documented with a fluoroscopic spot radiograph. The port was accessed with a Huber needle, aspirated and  flushed with heparinized saline.  The venotomy site was closed with an interrupted 4-0 Vicryl suture. The port pocket incision was closed with interrupted 2-0 Vicryl suture and the skin was opposed with a running subcuticular 4-0 Vicryl suture. Dermabond and Steri-strips were applied to both incisions. Dressings were placed. The patient tolerated the procedure well without immediate post procedural complication.  FINDINGS: After catheter placement, the tip lies within the superior cavoatrial junction. The catheter aspirates and flushes normally and is ready for immediate use.  IMPRESSION: Successful placement of a right internal jugular approach power injectable [name]. The catheter is ready for immediate use.   Electronically Signed   By: Sandi Mariscal M.D.   On: 03/29/2014 12:01   Ir US Guide Vasc Access Right  03/29/2014   INDICATION: History of metastatic squamous cell carcinoma to the bone. In need of durable intravenous access for chemotherapy administration.  EXAM: IMPLANTED PORT A CATH PLACEMENT WITH ULTRASOUND AND FLUOROSCOPIC GUIDANCE  COMPARISON:  None.  MEDICATIONS: Vancomycin 1 gm IV; The antibiotic was administered within an appropriate time interval prior to skin puncture.  ANESTHESIA/SEDATION: Versed 1.5 mg IV; Fentanyl 75 mcg IV;  Total Moderate Sedation Time  23 minutes.  CONTRAST:  None  FLUOROSCOPY TIME:  18 seconds (3 mGy).  COMPLICATIONS: None immediate  PROCEDURE: The procedure, risks, benefits, and alternatives were explained to the patient. Questions regarding the procedure were encouraged and answered. The patient understands and consents to the procedure.  The right neck and chest were prepped with chlorhexidine in a sterile fashion, and a sterile drape was applied covering the operative field. Maximum barrier sterile technique with sterile gowns and gloves were used for the procedure. A timeout was performed prior to the initiation of the procedure. Local anesthesia was provided with  1% lidocaine with epinephrine.  After creating a small venotomy incision, a micropuncture kit was utilized to access the internal jugular vein under direct, real-time ultrasound guidance. Ultrasound image documentation was performed. The microwire was kinked to measure appropriate catheter length.  A subcutaneous port pocket was then created along the upper chest wall utilizing a combination of sharp and blunt dissection. The pocket was irrigated with sterile saline. A single lumen thin power injectable port was chosen for placement. The 8 Fr catheter was tunneled from the port pocket site to the venotomy incision. The port was placed in the pocket. The external catheter was trimmed to appropriate length. At the venotomy, an 8 Fr peel-away sheath was placed over a guidewire under fluoroscopic guidance. The catheter was then placed through the sheath and the sheath was removed.  Final catheter positioning was confirmed and documented with a fluoroscopic spot radiograph. The port was accessed with a Huber needle, aspirated and flushed with heparinized saline.  The venotomy site was closed with an interrupted 4-0 Vicryl suture. The port pocket incision was closed with interrupted 2-0 Vicryl suture and the skin was opposed with a running subcuticular 4-0 Vicryl suture. Dermabond and Steri-strips were applied to both incisions. Dressings were placed. The patient tolerated the procedure well without immediate post procedural complication.  FINDINGS: After catheter placement, the tip lies within the superior cavoatrial junction. The catheter aspirates and flushes normally and is ready for immediate use.  IMPRESSION: Successful placement of a right internal jugular approach power injectable [name]. The catheter is ready for immediate use.   Electronically Signed   By: Sandi Mariscal M.D.   On: 03/29/2014 12:01   Dg Chest Port 1 View  04/01/2014   CLINICAL DATA:  Ovarian cancer.  Confusion.  EXAM: PORTABLE CHEST - 1 VIEW   COMPARISON:  10/15/2013  FINDINGS: Right [name] is in place with the tip in the SVC. Heart is normal size. Linear atelectasis in the right lung base. Left lung is clear. No effusions. No acute bony abnormality.  IMPRESSION: Right basilar atelectasis.   Electronically Signed   By: Rolm Baptise M.D.   On: 04/01/2014 09:40    Microbiology: Recent Results (from the past 240 hour(s))  Culture, blood (routine x 2)     Status: None (Preliminary result)   Collection Time: 04/01/14  9:45 AM  Result Value Ref Range Status   Specimen Description BLOOD RIGHT ANTECUBITAL  Final   Special Requests   Final    BOTTLES DRAWN AEROBIC AND ANAEROBIC 6CC  IMMUNE:COMPROMISED   Culture NO GROWTH 1 DAY  Final   Report Status PENDING  Incomplete  Culture, blood (routine x 2)     Status: None (Preliminary result)   Collection Time: 04/01/14 10:07 AM  Result Value Ref Range Status   Specimen Description BLOOD RIGHT ARM  Final   Special Requests   Final    BOTTLES DRAWN AEROBIC AND ANAEROBIC 6CC  IMMUNE:COMPROMISED   Culture NO GROWTH 1 DAY  Final   Report Status PENDING  Incomplete     Labs: Basic Metabolic Panel:  Recent Labs Lab 03/29/14 0900 04/01/14 0945 04/02/14 0603  NA 133* 129* 137  K 3.7 3.6 4.0  CL 98 97 105  CO2 $Re'29 29 29  'SIu$ GLUCOSE 122* 121* 104*  BUN 10 7 5*  CREATININE 0.53 0.62 0.46*  CALCIUM 8.9 8.3* 8.5   Liver Function Tests:  Recent Labs Lab 04/01/14 0945  AST 20  ALT 11  ALKPHOS 80  BILITOT 0.5  PROT 5.9*  ALBUMIN 3.1*   No results for input(s): LIPASE, AMYLASE in the last 168 hours. No results for input(s): AMMONIA in the last 168 hours. CBC:  Recent Labs Lab 03/29/14 0900 04/01/14 0945 04/02/14 0603  WBC 4.8 7.4 4.0  NEUTROABS  --  5.7  --   HGB 10.2* 10.2* 9.1*  HCT 31.6* 32.3* 28.7*  MCV 89.5 90.0 89.7  PLT 148* 133* 125*   Cardiac Enzymes: No results for input(s): CKTOTAL, CKMB, CKMBINDEX, TROPONINI in the last 168 hours. BNP: BNP (last 3  results) No results for input(s): BNP in the last 8760 hours.  ProBNP (last 3 results) No results for input(s): PROBNP in the last 8760 hours.  CBG: No results for input(s): GLUCAP in the last 168 hours.  SignedKinnie Feil  [facility] 04/02/2014, 11:08 AM

## 2014-04-02 NOTE — Progress Notes (Signed)
Discharge instructions and prescriptions given to patient's granddaughter and daughter who verbalized understanding, out in stable condition via w/c with staff.

## 2014-04-02 NOTE — Care Management Note (Signed)
    Page 1 of 1   04/02/2014     11:14:35 AM CARE MANAGEMENT NOTE 04/02/2014  Patient:  Tanya Ellis, Tanya Ellis   Account Number:  [id number]  Date Initiated:  04/02/2014  Documentation initiated by:  Jolene Provost  Subjective/Objective Assessment:   Pt is from home, lives with daughter and granddaughter who also care for pt. Pt is admitted for encephelopathy. Pt has telehealth through [facility] with RN calling every 2 weeks and making visit if needed.     Action/Plan:   Pt has no  DMe's or med needs prior to admission. Pt plans to dsicharge home today.  Pt will have OP PT per PT's recommendations. Pt's family requests OP vs HH bc pt becomes agitated when people come into home.   Anticipated DC Date:  04/02/2014   Anticipated DC Plan:  HOME/SELF CARE  In-house referral  Clinical Social Worker      DC Planning Services  CM consult      Choice offered to / List presented to:             Status of service:  Completed, signed off Medicare Important Message given?   (If response is "NO", the following Medicare IM given date fields will be blank) Date Medicare IM given:   Medicare IM given by:   Date Additional Medicare IM given:   Additional Medicare IM given by:    Discharge Disposition:  HOME/SELF CARE  Per UR Regulation:    If discussed at Long Length of Stay Meetings, dates discussed:    Comments:  04/02/2014 [place], RN, MSN, CM OP PT order faxed to [facility]. CSW aware of consult for pt welfare at home.  No further CM needs at this time.

## 2014-04-02 NOTE — Evaluation (Signed)
Physical Therapy Evaluation Patient Details Name: Tanya Ellis MRN: [id number] DOB: Apr 25, 1940 Today's Date: 04/02/2014   History of Present Illness  74 year old woman with metastatic endometrial cancer on long-acting and breakthrough pain medication who presented to the emergency department with a 48 hour history of increasing confusion with hallucinations and generalized weakness. Initial investigation revealed modest hyponatremia and imaging and ancillary studies were unremarkable. Patient was planned for observation overnight and further investigation as below. In the emergency department afebrile, VSS, no hypoxia; imaging CT head, CXR unremarkable. EKG SR, no acute changes. Blood work revealed modest hyponatremia, urinalysis was unremarkable.    Clinical Impression  Ms. Darleny Ellis, a 74 year old female, seen today for PT Initial Evaluation, in bed, awake, alert, oriented with intermittent confusion, display paranoia "that might be poison they are giving me",  able to follow commands but insisted on doing things her way. She lives with children ( son, daughter, and granddaughter). Per family report, Patient is independent in all ADL and very active at home. Current functional status: bed mobility is independent, transfers is Modified independent with Rolling walker (2-Wheeled) for safety due to deconditioning, Gait is Modified Independence with Rolling walker (2-wheeled) due to deconditioning and generalized weakness. Patient is nearing her baseline. Recommendation for HHPT upon discharge per family preference.     Follow Up Recommendations Home health PT (per family preference upon D/C)    Equipment Recommendations  none  Recommendations for Other Services HHPT per patient and family preference upon D/C     Precautions / Restrictions Precautions Precautions: Fall Restrictions Weight Bearing Restrictions: No      Mobility  Bed Mobility Overal bed mobility: Independent    Transfers Overall transfer level: Modified independent Equipment used: Rolling walker (2 wheeled)    Ambulation/Gait Ambulation/Gait assistance: Modified independent (Device/Increase time) Ambulation Distance (Feet): 10 Feet Assistive device: Rolling walker (2 wheeled) Gait Pattern/deviations: Wide base of support;Step-to pattern;Trunk flexed   Gait velocity interpretation: at or above normal speed for age/gender          Pertinent Vitals/Pain Pain Assessment: 0-10 Pain Score: 2  Pain Location: With movement, pain on left thigh going down the leg and ankle when raising the leg up. At rest, pain on Left ankle Pain Descriptors / Indicators: Burning;Aching Pain Intervention(s): Limited activity within patient's tolerance    Home Living Family/patient expects to be discharged to:: Private residence Living Arrangements: Children Available Help at Discharge: Available 24 hours/day;Family;Home health (Children are available 24 hours/day. HHPT per family preference upon D/C) Type of Home: House Home Access: Stairs to enter Entrance Stairs-Rails: Right Entrance Stairs-Number of Steps: 4 Home Layout: Able to live on main level with bedroom/bathroom;Two level Home Equipment: Bedside commode;Shower seat;Tub bench;Grab bars - toilet;Grab bars - tub/shower      Prior Function Level of Independence: Independent      Hand Dominance   Dominant Hand: Right    Extremity/Trunk Assessment   Upper Extremity Assessment: Defer to OT evaluation  Lower Extremity Assessment: Generalized weakness (deconditioning) Cervical / Trunk Assessment: Kyphotic    Communication   Communication: No difficulties  Cognition Arousal/Alertness: Awake/alert (Slightly disoriented ) Behavior During Therapy: Impulsive;Agitated;WFL for tasks assessed/performed Overall Cognitive Status: Within Functional Limits for tasks assessed              Assessment/Plan    PT Assessment All further PT needs can  be met in the next venue of care  PT Diagnosis Generalized weakness (Deconditioning)   PT Problem List Decreased strength;Decreased activity tolerance;Decreased  mobility  PT Treatment Interventions     PT Goals (Current goals can be found in the Care Plan section) Acute Rehab PT Goals Patient Stated Goal: to go home PT Goal Formulation: With patient/family Time For Goal Achievement: 04/16/14 Potential to Achieve Goals: Good       Barriers to discharge  none      End of Session Equipment Utilized During Treatment: Gait belt Activity Tolerance: Patient tolerated treatment well Patient left: in chair;with call bell/phone within reach;with bed alarm set Nurse Communication: Mobility status     Time: 6999-6722 PT Time Calculation (min) (ACUTE ONLY): 42 min   Charges:   PT Evaluation $Initial PT Evaluation Tier I: 1 Procedure     PT G Codes:   PT G-Codes **NOT FOR INPATIENT CLASS** Functional Assessment Tool Used: Clinical Judgement Functional Limitation: Mobility: Walking and moving around Mobility: Walking and Moving Around Current Status ([id number]): At least 1 percent but less than 20 percent impaired, limited or restricted Mobility: Walking and Moving Around Goal Status [phone]): At least 1 percent but less than 20 percent impaired, limited or restricted Mobility: Walking and Moving Around Discharge Status [phone]): At least 1 percent but less than 20 percent impaired, limited or restricted    Treyshaun Keatts A 04/02/2014, 10:02 AM

## 2014-04-05 ENCOUNTER — Telehealth (HOSPITAL_COMMUNITY): Payer: Self-pay | Admitting: *Deleted

## 2014-04-05 NOTE — Telephone Encounter (Signed)
Attempted to reach patient re: a triage called placed by patient earlier this AM. No answer and no availability for voice mail to be left.

## 2014-04-06 ENCOUNTER — Encounter (HOSPITAL_COMMUNITY): Payer: Self-pay | Admitting: Hematology & Oncology

## 2014-04-06 LAB — CULTURE, BLOOD (ROUTINE X 2)
Culture: NO GROWTH
Culture: NO GROWTH

## 2014-04-08 ENCOUNTER — Inpatient Hospital Stay (HOSPITAL_COMMUNITY): Payer: Medicare Other

## 2014-04-08 ENCOUNTER — Ambulatory Visit (HOSPITAL_COMMUNITY): Payer: Medicare Other | Admitting: Hematology & Oncology

## 2014-04-09 ENCOUNTER — Encounter (HOSPITAL_COMMUNITY): Payer: Medicare Other

## 2014-04-09 ENCOUNTER — Encounter: Payer: Self-pay | Admitting: *Deleted

## 2014-04-09 ENCOUNTER — Encounter (HOSPITAL_COMMUNITY): Payer: Self-pay | Admitting: Hematology & Oncology

## 2014-04-09 ENCOUNTER — Encounter (HOSPITAL_BASED_OUTPATIENT_CLINIC_OR_DEPARTMENT_OTHER): Payer: Medicare Other | Admitting: Hematology & Oncology

## 2014-04-09 VITALS — BP 115/69 | HR 75 | Temp 98.3°F | Resp 18 | Wt 144.8 lb

## 2014-04-09 DIAGNOSIS — C541 Malignant neoplasm of endometrium: Secondary | ICD-10-CM

## 2014-04-09 DIAGNOSIS — C778 Secondary and unspecified malignant neoplasm of lymph nodes of multiple regions: Secondary | ICD-10-CM | POA: Diagnosis not present

## 2014-04-09 MED ORDER — HYDROCODONE-ACETAMINOPHEN 10-325 MG PO TABS: 1.0000 | ORAL_TABLET | ORAL | Status: AC | PRN

## 2014-04-09 MED ORDER — ALPRAZOLAM 0.5 MG PO TABS: 0.5000 mg | ORAL_TABLET | Freq: Every evening | ORAL | Status: DC | PRN

## 2014-04-09 NOTE — Patient Instructions (Addendum)
[place] at [facility] Discharge Instructions  RECOMMENDATIONS MADE BY THE CONSULTANT AND ANY TEST RESULTS WILL BE SENT TO YOUR REFERRING PHYSICIAN. Prescriptions given for Hydrocodone 10/325 - take as directed Alprazolam 0.5mg  - take as directed.  Discussion by Dr. Whitney Muse. Not sure you will be able to tolerate chemotherapy and would like to have someone from Hospice come and meet with you and your granddaughter Tanya Ellis to discuss their services and what they can provide for you. They can help [place] with your pain management and provide you with other services that might improve your quality of life. If you decide you do not want to go with Hospice let us know and we will see you back. Mickie Kay, RN  [phone])  Thank you for choosing [place] at [facility] to provide your oncology and hematology care.  To afford each patient quality time with our provider, please arrive at least 15 minutes before your scheduled appointment time.    You need to re-schedule your appointment should you arrive 10 or more minutes late.  We strive to give you quality time with our providers, and arriving late affects you and other patients whose appointments are after yours.  Also, if you no show three or more times for appointments you may be dismissed from the clinic at the providers discretion.     Again, thank you for choosing [facility].  Our hope is that these requests will decrease the amount of time that you wait before being seen by our physicians.       _____________________________________________________________  Should you have questions after your visit to [facility], please contact our office at [phone] between the hours of 8:30 a.m. and 4:30 p.m.  Voicemails left after 4:30 p.m. will not be returned until the following business day.  For prescription refill requests, have your pharmacy contact our office.

## 2014-04-09 NOTE — Progress Notes (Signed)
[place] Clinical Social Work  Clinical Social Work was referred by Medical sales representative for assessment of psychosocial needs due to pt and granddaughter disagreeing on plan for medication management.  Clinical Social Worker met with patient and granddaughter at [facility] to offer support and assess for needs.  CSW has been involved in the past for multiple concerns. Currently, pt is wanting to manage her own medications in the home. Granddaughter and pt had previously worked out a safe medication Sales promotion account executive where granddaughter keeps medication at her home locked up and brings over required dose each day. This was necessary for safety as drug seekers reside in the home and also visit frequently.  CSW and granddaughter attempted to come up with possible other solutions. However, pt was NOT interested in any of those currently. She had one idea that she could get to a pharmacy daily, this is not possible. She still wants to live in her home with her children that struggle with addiction and live with her cats. CSW and granddaughter discussed that pt has right to make her own decisions currently, but that when safety is concerned others may have to alter the plan or get involved to assist with a safer plan. CSW also discussed case with MD as well.   CSW discussed with granddaughter that APS may have to get involved if pt is putting herself in danger or is not able to make decisions that keep her safe. Granddaughter educated that she could also make report to APS as needed, if things deteriorate. She stated understanding and agrees to reach out to CSW as needed.     Clinical Social Work interventions: Nurse, children's discussion and education Resource education  Loren Racer, [place] Tuesdays 8:30-1pm Wednesdays 8:30-12pm  [phone]

## 2014-04-10 ENCOUNTER — Encounter: Payer: Self-pay | Admitting: *Deleted

## 2014-04-11 ENCOUNTER — Encounter: Payer: Self-pay | Admitting: Radiation Oncology

## 2014-04-11 ENCOUNTER — Ambulatory Visit
Admission: RE | Admit: 2014-04-11 | Discharge: 2014-04-11 | Disposition: A | Discharge status: Home / Self Care | Payer: Medicare Other | Source: Ambulatory Visit | Attending: Radiation Oncology | Admitting: Radiation Oncology

## 2014-04-11 VITALS — BP 127/76 | HR 84 | Temp 98.1°F | Resp 16 | Wt 143.0 lb

## 2014-04-11 DIAGNOSIS — C541 Malignant neoplasm of endometrium: Secondary | ICD-10-CM

## 2014-04-11 NOTE — Progress Notes (Signed)
Patient reports right hip, right leg, rectal and low abdominal pain. Denies dysuria and hematuria. Reports skin of perineum has healed following radiation. Denies diarrhea. Denies incontinence. Denies vaginal odor, itching or discharge. Ambulating with the aid of a cane. Patient understand from Dr. Whitney Muse that chemo isn't recommended. Hospitalized for encephalopathy on 2/8.

## 2014-04-11 NOTE — Assessment & Plan Note (Addendum)
75 year old female with advanced gynecologic malignancy, cervical versus endometrial. I had a discussion with the patient and her granddaughter that treatment with chemotherapy consists of the same drugs regardless of the exact primary. I discussed with them that treatment is palliative. They understand what this means. My concern moving forward is the patient's social situation. She has a lot of difficulties at home with family and in addition there appeared to be problems with pain medication dye version. Granddaughter is currently bringing her her pain medication daily.  Should Tanya Ellis opt to proceed with chemotherapy I would offer her split dose Carbo and Taxol. She is relatively physically fit and I believe may be able to tolerate treatment fairly well. Zometa has also been recommended and the patient has an upcoming dental appointment. We will arrange for chemotherapy teaching, have her get to her dentist, and continue to work with social work in regards to her social issues. I will plan on seeing her back in the next week or so at which time we will make firm decisions regarding treatment.

## 2014-04-11 NOTE — Progress Notes (Signed)
   Department of Radiation Oncology  Phone:  [phone] Fax:        [phone]   Name: Jessina Marse MRN: [id number]  DOB: 1940-10-30  Date: 04/11/2014  Follow Up Visit Note  Diagnosis: Endometrial cancer   Staging form: Corpus Uteri - Carcinoma, AJCC 7th Edition     Clinical stage from 02/21/2014: Stage IVB (T0, N2, M1) - Signed by Thea Silversmith, MD on 02/21/2014   Summary and Interval since last radiation: 1 month from palliative RT to the pelvis completed 03/13/14  Interval History: Tanya Ellis presents today for routine followup.  Her pain is slightly better although she still needs pain medications every day. She is accompanied by her granddaughter. Her issues with narcotic diversion continue. She was hospitalized after a tooth removal and portacath placement. She has had some confusion but still lives alone. She is looking forward to working in her garden this spring.  She has discussed chemotherapy with Dr. Whitney Muse who thought that she would tolerate this poorly and likely not be able to maintain the quality of life she desired because of this.  She is greatly distressed by this.  Her goals are to stay in her own home as much as possible and be with her cats and family.   Physical Exam:  Filed Vitals:   04/11/14 0912  BP: 127/76  Pulse: 84  Temp: 98.1 F (36.7 C)  TempSrc: Oral  Resp: 16  Weight: 143 lb (64.864 kg)  SpO2: 96%   Pleasant female. Sometimes tearful. Not in pain. Alert and oriented x 3.   IMPRESSION: Erva is a 74 y.o. female s/p palliative radiation to the pelvis with goals of palliative care.   PLAN:  I discussed the goals of palliative treatment. We discussed treatments that can help her accomplish her goals and also aggressive pain control. We discussed that she had a significant hospitalization for minor surgical procedures.  It is possible that she could be really debilitated with even minor chemotherapy or possibly die.  She has a follow up with Dr. Whitney Muse  next week. I will see her back prn.     Thea Silversmith, MD

## 2014-04-12 ENCOUNTER — Other Ambulatory Visit (HOSPITAL_COMMUNITY): Payer: Self-pay | Admitting: Hematology & Oncology

## 2014-04-12 MED ORDER — MORPHINE SULFATE ER 30 MG PO TBCR: 30.0000 mg | EXTENDED_RELEASE_TABLET | Freq: Two times a day (BID) | ORAL | Status: DC

## 2014-04-12 MED ORDER — ALPRAZOLAM 0.5 MG PO TABS: 0.5000 mg | ORAL_TABLET | Freq: Three times a day (TID) | ORAL | Status: AC | PRN

## 2014-04-22 ENCOUNTER — Ambulatory Visit (HOSPITAL_COMMUNITY): Payer: Medicare Other | Admitting: Hematology & Oncology

## 2014-04-22 ENCOUNTER — Inpatient Hospital Stay (HOSPITAL_COMMUNITY): Payer: Medicare Other

## 2014-04-23 ENCOUNTER — Ambulatory Visit (HOSPITAL_COMMUNITY): Payer: Medicare Other | Attending: Internal Medicine | Admitting: Physical Therapy
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
## 2014-04-29 ENCOUNTER — Inpatient Hospital Stay (HOSPITAL_COMMUNITY): Payer: Medicare Other

## 2014-04-30 ENCOUNTER — Telehealth (HOSPITAL_COMMUNITY): Payer: Self-pay

## 2014-04-30 DIAGNOSIS — R609 Edema, unspecified: Secondary | ICD-10-CM

## 2014-04-30 MED ORDER — FUROSEMIDE 20 MG PO TABS: 20.0000 mg | ORAL_TABLET | Freq: Every day | ORAL | Status: AC

## 2014-04-30 MED ORDER — POTASSIUM CHLORIDE ER 10 MEQ PO TBCR: 10.0000 meq | EXTENDED_RELEASE_TABLET | Freq: Every day | ORAL | Status: AC

## 2014-04-30 NOTE — Telephone Encounter (Signed)
Call from Tanya Ellis with Hospice requesting something for bilateral hand and leg edema.  Discussed with Dr. Whitney Muse and can take Lasix 20 mg daily # 30 with 2 refills and Potassium 10 meq daily with lasix # 30 with 2 refills.  Meds to be called in by Hospice.

## 2014-05-13 ENCOUNTER — Ambulatory Visit (HOSPITAL_COMMUNITY): Payer: Medicare Other | Admitting: Hematology & Oncology

## 2014-05-13 ENCOUNTER — Inpatient Hospital Stay (HOSPITAL_COMMUNITY): Payer: Medicare Other

## 2014-05-20 ENCOUNTER — Inpatient Hospital Stay (HOSPITAL_COMMUNITY): Payer: Medicare Other

## 2014-05-27 ENCOUNTER — Telehealth: Payer: Self-pay

## 2014-05-27 ENCOUNTER — Ambulatory Visit (HOSPITAL_COMMUNITY): Payer: Medicare Other

## 2014-05-27 NOTE — Telephone Encounter (Signed)
Patients granddaughter Elmyra Ricks called to inquire if patient could have any further radiation as she is still having significant pain.Currently on ms contin 60 mg bid and percocet 2 pills every 4 hours break-through pain of hip/pelvis.She is now being seen by hospice.

## 2014-05-28 ENCOUNTER — Other Ambulatory Visit (HOSPITAL_COMMUNITY): Payer: Self-pay | Admitting: Oncology

## 2014-05-28 DIAGNOSIS — C7951 Secondary malignant neoplasm of bone: Secondary | ICD-10-CM

## 2014-05-28 MED ORDER — OXYCODONE HCL 20 MG PO TABS: 20.0000 mg | ORAL_TABLET | ORAL | Status: DC | PRN

## 2014-06-03 ENCOUNTER — Other Ambulatory Visit (HOSPITAL_COMMUNITY): Payer: Self-pay | Admitting: Oncology

## 2014-06-03 DIAGNOSIS — C7951 Secondary malignant neoplasm of bone: Secondary | ICD-10-CM

## 2014-06-03 MED ORDER — OXYCODONE HCL 20 MG PO TABS: 20.0000 mg | ORAL_TABLET | ORAL | Status: DC | PRN

## 2014-06-06 ENCOUNTER — Telehealth (HOSPITAL_COMMUNITY): Payer: Self-pay | Admitting: Oncology

## 2014-06-06 ENCOUNTER — Other Ambulatory Visit (HOSPITAL_COMMUNITY): Payer: Self-pay | Admitting: Oncology

## 2014-06-06 DIAGNOSIS — C7951 Secondary malignant neoplasm of bone: Secondary | ICD-10-CM

## 2014-06-06 MED ORDER — OXYCODONE HCL 20 MG PO TABS: 20.0000 mg | ORAL_TABLET | ORAL | Status: DC | PRN

## 2014-06-06 NOTE — Telephone Encounter (Signed)
Hospice called reporting that the patient's sister was un-accepting of compression stockings to help with lymphedema and threatened Hospice in a legal manner.  I was requested to call her to explain why Hospice discourages compression stockings and re-iterate their stance in hopes of a positive PR maneuver.  I spoke with Donnelly Angelica [phone]) and explained the situation and why compression stocking for malignancy-induced lymphedema in the this particular case is not helpful.  The patient has pelvic obstruction from malignancy and therefore compression stockings would increase her pain and not be beneficial due to the obstruction of flow.    Carline clearly is grieving her sister's prognosis.  She reports that there are issues with her medications to the point that the patient is pocketing her medications and spitting them out because her family is pushing PRN medications when they are not needed.  She questioned the patient's Xanax frequency and pain medication dose.  Unfortunately there is a very poor family situation which has been one of the major issues all along.  I recommended considering admission to SNF with Hospice or Hospice home to help with management of medications, either long-term or short-term.  Carline reports that that has been considered but Kaedyn wishes to die at home.    Carline wanted to vent and I was fortunate enough to have some time for her to do that with me this evening.  Unfortunately, I cannot fix the patient's home life and therefore I could only offer a listening ear. She is welcomed to call us any time.  Donnajean Chesnut 06/06/2014 5:37 PM.

## 2014-06-07 NOTE — Telephone Encounter (Signed)
Error

## 2014-06-11 ENCOUNTER — Other Ambulatory Visit (HOSPITAL_COMMUNITY): Payer: Self-pay | Admitting: Oncology

## 2014-06-11 DIAGNOSIS — C7951 Secondary malignant neoplasm of bone: Secondary | ICD-10-CM

## 2014-06-11 MED ORDER — OXYCODONE HCL 20 MG PO TABS: 20.0000 mg | ORAL_TABLET | ORAL | Status: DC | PRN

## 2014-06-17 ENCOUNTER — Other Ambulatory Visit (HOSPITAL_COMMUNITY): Payer: Self-pay | Admitting: Oncology

## 2014-06-17 DIAGNOSIS — C7951 Secondary malignant neoplasm of bone: Secondary | ICD-10-CM

## 2014-06-17 MED ORDER — OXYCODONE HCL 20 MG PO TABS: 20.0000 mg | ORAL_TABLET | ORAL | Status: DC | PRN

## 2014-06-21 ENCOUNTER — Other Ambulatory Visit (HOSPITAL_COMMUNITY): Payer: Self-pay | Admitting: Oncology

## 2014-06-21 DIAGNOSIS — C7951 Secondary malignant neoplasm of bone: Secondary | ICD-10-CM

## 2014-06-21 MED ORDER — OXYCODONE HCL 20 MG PO TABS: 20.0000 mg | ORAL_TABLET | ORAL | Status: DC | PRN

## 2014-06-25 ENCOUNTER — Other Ambulatory Visit (HOSPITAL_COMMUNITY): Payer: Self-pay | Admitting: Oncology

## 2014-06-25 DIAGNOSIS — C7951 Secondary malignant neoplasm of bone: Secondary | ICD-10-CM

## 2014-06-25 MED ORDER — OXYCODONE HCL 20 MG PO TABS: 20.0000 mg | ORAL_TABLET | ORAL | Status: DC | PRN

## 2014-07-01 ENCOUNTER — Other Ambulatory Visit (HOSPITAL_COMMUNITY): Payer: Self-pay | Admitting: Oncology

## 2014-07-01 DIAGNOSIS — C7951 Secondary malignant neoplasm of bone: Secondary | ICD-10-CM

## 2014-07-01 MED ORDER — OXYCODONE HCL 20 MG PO TABS: 20.0000 mg | ORAL_TABLET | ORAL | Status: DC | PRN

## 2014-07-04 ENCOUNTER — Other Ambulatory Visit (HOSPITAL_COMMUNITY): Payer: Self-pay | Admitting: Oncology

## 2014-07-04 DIAGNOSIS — C7951 Secondary malignant neoplasm of bone: Secondary | ICD-10-CM

## 2014-07-04 MED ORDER — OXYCODONE HCL 20 MG PO TABS: 20.0000 mg | ORAL_TABLET | ORAL | Status: DC | PRN

## 2014-07-08 ENCOUNTER — Other Ambulatory Visit (HOSPITAL_COMMUNITY): Payer: Self-pay | Admitting: Oncology

## 2014-07-08 DIAGNOSIS — C7951 Secondary malignant neoplasm of bone: Secondary | ICD-10-CM

## 2014-07-08 MED ORDER — OXYCODONE HCL 20 MG PO TABS: 20.0000 mg | ORAL_TABLET | ORAL | Status: DC | PRN

## 2014-07-20 ENCOUNTER — Other Ambulatory Visit (HOSPITAL_COMMUNITY): Payer: Self-pay | Admitting: Oncology

## 2014-07-23 ENCOUNTER — Other Ambulatory Visit (HOSPITAL_COMMUNITY): Payer: Self-pay | Admitting: Oncology

## 2014-07-23 DIAGNOSIS — C7951 Secondary malignant neoplasm of bone: Secondary | ICD-10-CM

## 2014-07-23 MED ORDER — OXYCODONE HCL 20 MG PO TABS: 20.0000 mg | ORAL_TABLET | ORAL | Status: AC | PRN

## 2014-07-29 ENCOUNTER — Emergency Department (HOSPITAL_COMMUNITY)
Admission: EM | Admit: 2014-07-29 | Discharge: 2014-07-29 | Disposition: A | Discharge status: Home / Self Care | Payer: Medicare Other | Attending: Emergency Medicine | Admitting: Emergency Medicine

## 2014-07-29 ENCOUNTER — Emergency Department (HOSPITAL_COMMUNITY): Payer: Medicare Other

## 2014-07-29 ENCOUNTER — Encounter (HOSPITAL_COMMUNITY): Payer: Self-pay | Admitting: Emergency Medicine

## 2014-07-29 DIAGNOSIS — Z8679 Personal history of other diseases of the circulatory system: Secondary | ICD-10-CM | POA: Insufficient documentation

## 2014-07-29 DIAGNOSIS — Z8619 Personal history of other infectious and parasitic diseases: Secondary | ICD-10-CM | POA: Insufficient documentation

## 2014-07-29 DIAGNOSIS — Z86010 Personal history of colonic polyps: Secondary | ICD-10-CM | POA: Diagnosis not present

## 2014-07-29 DIAGNOSIS — H5451 Low vision, right eye, normal vision left eye: Secondary | ICD-10-CM | POA: Diagnosis not present

## 2014-07-29 DIAGNOSIS — F0390 Unspecified dementia without behavioral disturbance: Secondary | ICD-10-CM | POA: Insufficient documentation

## 2014-07-29 DIAGNOSIS — K259 Gastric ulcer, unspecified as acute or chronic, without hemorrhage or perforation: Secondary | ICD-10-CM | POA: Insufficient documentation

## 2014-07-29 DIAGNOSIS — Z79899 Other long term (current) drug therapy: Secondary | ICD-10-CM | POA: Diagnosis not present

## 2014-07-29 DIAGNOSIS — Z8742 Personal history of other diseases of the female genital tract: Secondary | ICD-10-CM | POA: Diagnosis not present

## 2014-07-29 DIAGNOSIS — Z8701 Personal history of pneumonia (recurrent): Secondary | ICD-10-CM | POA: Insufficient documentation

## 2014-07-29 DIAGNOSIS — Z8612 Personal history of poliomyelitis: Secondary | ICD-10-CM | POA: Diagnosis not present

## 2014-07-29 DIAGNOSIS — K219 Gastro-esophageal reflux disease without esophagitis: Secondary | ICD-10-CM | POA: Diagnosis not present

## 2014-07-29 DIAGNOSIS — M1990 Unspecified osteoarthritis, unspecified site: Secondary | ICD-10-CM | POA: Insufficient documentation

## 2014-07-29 DIAGNOSIS — Z85038 Personal history of other malignant neoplasm of large intestine: Secondary | ICD-10-CM | POA: Insufficient documentation

## 2014-07-29 DIAGNOSIS — Z87891 Personal history of nicotine dependence: Secondary | ICD-10-CM | POA: Insufficient documentation

## 2014-07-29 DIAGNOSIS — Z923 Personal history of irradiation: Secondary | ICD-10-CM | POA: Insufficient documentation

## 2014-07-29 DIAGNOSIS — Z8541 Personal history of malignant neoplasm of cervix uteri: Secondary | ICD-10-CM | POA: Diagnosis not present

## 2014-07-29 DIAGNOSIS — N390 Urinary tract infection, site not specified: Secondary | ICD-10-CM | POA: Insufficient documentation

## 2014-07-29 DIAGNOSIS — R4182 Altered mental status, unspecified: Secondary | ICD-10-CM | POA: Diagnosis present

## 2014-07-29 HISTORY — DX: Malignant neoplasm of endometrium: C54.1

## 2014-07-29 HISTORY — DX: Problem related to social environment, unspecified: Z60.9

## 2014-07-29 HISTORY — DX: Problem related to unspecified psychosocial circumstances: Z65.9

## 2014-07-29 HISTORY — DX: Encounter for palliative care: Z51.5

## 2014-07-29 HISTORY — DX: Other amnesia: R41.3

## 2014-07-29 LAB — COMPREHENSIVE METABOLIC PANEL
ALT: 9 U/L — ABNORMAL LOW (ref 14–54)
AST: 19 U/L (ref 15–41)
Albumin: 3.7 g/dL (ref 3.5–5.0)
Alkaline Phosphatase: 138 U/L — ABNORMAL HIGH (ref 38–126)
Anion gap: 8 (ref 5–15)
BUN: 7 mg/dL (ref 6–20)
CALCIUM: 8.9 mg/dL (ref 8.9–10.3)
CO2: 29 mmol/L (ref 22–32)
Chloride: 98 mmol/L — ABNORMAL LOW (ref 101–111)
Creatinine, Ser: 0.64 mg/dL (ref 0.44–1.00)
GFR calc non Af Amer: >60 mL/min (ref >60)
GLUCOSE: 118 mg/dL — AB (ref 65–99)
Potassium: 4.1 mmol/L (ref 3.5–5.1)
Sodium: 135 mmol/L (ref 135–145)
TOTAL PROTEIN: 6.8 g/dL (ref 6.5–8.1)
Total Bilirubin: 0.4 mg/dL (ref 0.3–1.2)

## 2014-07-29 LAB — URINALYSIS, ROUTINE W REFLEX MICROSCOPIC
Glucose, UA: NEGATIVE mg/dL
LEUKOCYTES UA: NEGATIVE
Nitrite: NEGATIVE
Protein, ur: 30 mg/dL — AB
Urobilinogen, UA: 1 mg/dL (ref 0.0–1.0)
pH: 5.5 (ref 5.0–8.0)

## 2014-07-29 LAB — CBC WITH DIFFERENTIAL/PLATELET
Basophils Absolute: 0 10*3/uL (ref 0.0–0.1)
Basophils Relative: 0 % (ref 0–1)
EOS ABS: 0.1 10*3/uL (ref 0.0–0.7)
Eosinophils Relative: 2 % (ref 0–5)
HEMATOCRIT: 33.4 % — AB (ref 36.0–46.0)
HEMOGLOBIN: 10.7 g/dL — AB (ref 12.0–15.0)
Lymphocytes Relative: 15 % (ref 12–46)
Lymphs Abs: 1 10*3/uL (ref 0.7–4.0)
MCH: 29 pg (ref 26.0–34.0)
MCHC: 32 g/dL (ref 30.0–36.0)
MCV: 90.5 fL (ref 78.0–100.0)
MONO ABS: 0.7 10*3/uL (ref 0.1–1.0)
Monocytes Relative: 11 % (ref 3–12)
NEUTROS ABS: 4.5 10*3/uL (ref 1.7–7.7)
NEUTROS PCT: 72 % (ref 43–77)
Platelets: 153 10*3/uL (ref 150–400)
RBC: 3.69 MIL/uL — AB (ref 3.87–5.11)
RDW: 13.8 % (ref 11.5–15.5)
WBC: 6.3 10*3/uL (ref 4.0–10.5)

## 2014-07-29 LAB — URINE MICROSCOPIC-ADD ON

## 2014-07-29 LAB — LACTIC ACID, PLASMA: Lactic Acid, Venous: 1.2 mmol/L (ref 0.5–2.0)

## 2014-07-29 LAB — TROPONIN I: Troponin I: <0.03 ng/mL (ref <0.031)

## 2014-07-29 MED ORDER — CIPROFLOXACIN HCL 500 MG PO TABS: 500.0000 mg | ORAL_TABLET | Freq: Two times a day (BID) | ORAL | Status: AC

## 2014-07-29 MED ORDER — CIPROFLOXACIN HCL 250 MG PO TABS
500.0000 mg | ORAL_TABLET | Freq: Once | ORAL | Status: AC
  Administered 2014-07-29: 500 mg via ORAL
  Filled 2014-07-29: qty 2

## 2014-07-29 NOTE — ED Notes (Signed)
Per family pt has been very confused last couple of day. She is a hospice pt due to bone cancer.

## 2014-07-29 NOTE — Discharge Instructions (Signed)
°Emergency Department Resource Guide °1) Find a Doctor and Pay Out of Pocket °Although you won't have to find out who is covered by your insurance plan, it is a good idea to ask around and get recommendations. You will then need to call the office and see if the doctor you have chosen will accept you as a new patient and what types of options they offer for patients who are self-pay. Some doctors offer discounts or will set up payment plans for their patients who do not have insurance, but you will need to ask so you aren't surprised when you get to your appointment. ° °2) Contact Your Local Health Department °Not all health departments have doctors that can see patients for sick visits, but many do, so it is worth a call to see if yours does. If you don't know where your local health department is, you can check in your phone book. The CDC also has a tool to help you locate your state's health department, and many state websites also have listings of all of their local health departments. ° °3) Find a Walk-in Clinic °If your illness is not likely to be very severe or complicated, you may want to try a walk in clinic. These are popping up all over the country in pharmacies, drugstores, and shopping centers. They're usually staffed by nurse practitioners or physician assistants that have been trained to treat common illnesses and complaints. They're usually fairly quick and inexpensive. However, if you have serious medical issues or chronic medical problems, these are probably not your best option. ° °No Primary Care Doctor: °- Call [facility] at  832-8000 - they can help you locate a primary care doctor that  accepts your insurance, provides certain services, etc. °- Physician Referral Service- 1-800-533-3463 ° °Chronic Pain Problems: °Organization         Address  Phone   Notes  °[facility]  ([phone]) [phone] Patients need to be referred by their primary care doctor.  ° °Medication  Assistance: °Organization         Address  Phone   Notes  °[place] [facility] [address] °[place], [place] [address] (336) 641-8030 --Must be a resident of [place] °-- Must have NO insurance coverage whatsoever (no Medicaid/ Medicare, etc.) °-- The pt. MUST have a primary care doctor that directs their care regularly and follows them in the community °  °[facility]  (866) 331-1348   °[employer]  (888) 892-1162   ° °Agencies that provide inexpensive medical care: °Organization         Address  Phone   Notes  °[facility]  (336) 832-8035   °[facility]    (336) 832-7272   °[facility] [address] °[place], [place] [address] (336) 832-4777   °[facility] [address], °[place] (336) 271-4999   °[facility]    (336) 373-0678   °[facility]    (336) 272-1050   °[facility] ° [address], [place] Phone:  (336) 832-4444, Fax:  (336) 832-4440 Hours of Operation:  9 am - 6 pm, M-F.  Also accepts Medicaid/Medicare and self-pay.  °[facility] ° [address],  Phone: (336) [phone], Fax: (336) [phone]. Hours of Operation:  8:30 am - 5:30 pm, M-F.  Also accepts Medicaid and self-pay.  °[facility] [place] [address], [place] Phone: (336) 878-6027   °[facility] [address], [place], [place] (336)723-1848, Ext. 123 Mondays & Thursdays: 7-9 AM.  First 15 patients are seen on a first come, first serve basis. °  ° °Medicaid-accepting [place] Providers: ° °Organization         Address  Phone   Notes  °[facility] [address], [place] (336) 641-2100 Also accepts self-pay patients.  °[facility] [address], [place] ° (336) 856-9996   °[facility] [address], [place]  (336) 288-8857   °[facility] [address] [place] [address], [place] (336) 299-7000   °Veita Bland [address], [place]  ° (336) 373-1557 Only accepts [place] Access Medicaid patients after they have their name applied to their card.  ° °Self-Pay (no insurance) in [place]: ° °Organization         Address  Phone   Notes  °Sickle Cell Patients, [facility] [address], [place] (336) 832-1970   °[facility] [address], [place] (336) 832-4400   °[facility] ° [address], [place] (336) 992-4800   °[facility]/Dr. Osei-Bonsu ° [address] [place] [address], [place] or [address], [place] (336) [phone] Phone number for both [place] and [place] locations is the same.  °[facility] [address], [place] (336) 299-0000   °[facility] [address] [place] [address], [place] or [address] (336) 852-7530 °(336) 878-2260   °[facility] [address],  (336) 350-1642, phone; (336) 294-5005, fax Sees patients 1st and 3rd Saturday of every month.  Must not qualify for public or private insurance (i.e. Medicaid, Medicare, [place] Health Choice, Veterans' Benefits) • Household income should be no more than 200% of the poverty level •The clinic cannot treat you if you are pregnant or think you are pregnant • Sexually transmitted diseases are not treated at the clinic.  ° ° °Dental Care: °Organization         Address  Phone  Notes  °[place] [facility] [address],  (336) 641-6152 Accepts children up to age 21 who are enrolled in Medicaid or [place] Health Choice; pregnant women with a Medicaid card; and children who have applied for Medicaid or [place] Health Choice, but were declined, whose parents can pay a reduced fee at time of service.  °[place]  [facility] [place]  [address], [place] (336) 641-7733 Accepts children up to age 21 who are enrolled in Medicaid or [place] Health Choice; pregnant women with a Medicaid card; and children who have applied for Medicaid or [place] Health Choice, but were declined, whose parents can pay a reduced fee at time of service.  °[facility] ° [address],  (336) 641-4533 Patients are seen by appointment only. Walk-ins are not accepted. [facility] will see patients 18 years of age and older. °Monday - Tuesday (8am-5pm) °Most Wednesdays (8:30-5pm) °$30 per visit, cash only  °[facility] ° [address], [place] (336) 641-4533 Patients are seen by appointment only. Walk-ins are not accepted. [facility] will see patients 18 years of age and older. °One   Wednesday Evening (Monthly: Volunteer Based).  $30 per visit, cash only  °[facility]  (919) [phone] for adults; Children under age 4, call Graduate Pediatric Dentistry at ([phone]) [phone]. Children aged 4-14, please call ([phone]) [phone] to request a pediatric application. ° Dental services are provided in all areas of dental care including fillings, crowns and bridges, complete and partial dentures, implants, gum treatment, root canals, and extractions. Preventive care is also provided. Treatment is provided to both adults and children. °Patients are selected via a lottery and there is often a waiting list. °  °[facility] [address], °[place] ° (336) 763-8833 [email or web address] °  °[facility] [address], [place], [place] (336)723-1848, Ext. 123 Second and Fourth Thursday of each month, opens at 6:30 AM; Clinic ends at 9 AM.  Patients are seen on a first-come first-served basis, and a limited number are seen during each clinic.  ° °[facility] ° [address], [place], [place] (336) 723-7904    Eligibility Requirements °You must have lived in [place], [place], or [place] counties for at least the last three months. °  You cannot be eligible for state or federal sponsored healthcare insurance, including Veterans Administration, Medicaid, or Medicare. °  You generally cannot be eligible for healthcare insurance through your employer.  °  How to apply: °Eligibility screenings are held every Tuesday and Wednesday afternoon from 1:00 pm until 4:00 pm. You do not need an appointment for the interview!  °[facility] [address], [place], [place] 336-631-2330   °[facility]  336-342-8273   °[place]  336-703-3100   °[facility]  336-570-6415   ° °Behavioral Health Resources in the Community: °Intensive Outpatient Programs °Organization         Address  Phone  Notes  °[facility] [address], [place], [place] 336-878-6098   °[facility] [address], [place], [place] 336-832-9800   °[facility] [address], [place], [place] ° 336-882-2125   °[facility] [address],  °[place], [place] 1-800-853-5163 or 336-641-4981   °Substance Abuse Resources °Organization         Address  Phone  Notes  °[facility]  336-882-2125   °[facility]  336-784-9470   °[facility]  336-285-9073   °[facility]  336-845-3988   °Residential & Outpatient Substance Abuse Program  1-800-659-3381   °Psychological Services °Organization         Address  Phone  Notes  °[facility]  336- 832-9600   °[facility]  336- 378-7881   °[facility] [address], [place] 1-800-853-5163 or 336-641-4981   ° °Mobile Crisis Teams °Organization         Address  Phone  Notes  °[facility], Mobile Crisis Care Unit  1-877-626-1772   °Assertive °[facility] ° [address]  [place], [place] 336-834-9664   °Sharon DeEsch [address] °[place] 336-554-5454   ° °Self-Help/Support Groups °Organization         Address  Phone             Notes  °[facility] - variety of support groups  336- 373-1402 Call for more information  °Narcotics Anonymous (NA), [facility] [address], °[place]  2 meetings at this location  ° °  Residential Treatment Programs Organization         Address  Phone  Notes  [facility] [address],    [place]  1-(308)099-2336   [facility]  [address], [place] [address], [place], [place]   [place], [place] (346) [phone] Admissions: 8am-3pm M-F  [facility] [place] [address]    [place], [place] 765-465-0354   [facility] [address] [place], [place], [place]   [facility] [address],  [place], [place]   [facility] - Intensive Outpatient [address], [place], [place]   [facility] [place]  [place], [place] 1-442-138-2500 or 3201929138   [facility] [address] [place], [place] Accepts Medicaid  [facility] [address],  [place] 1-763 415 4250 Substance Abuse/Addiction Treatment   [facility]         Address  Phone  Notes  [facility]  479-470-1895   Domenic Schwab, PhD [address] Arlis Porta [place], [place]   220 324 7116 or 878-057-5553   [facility] [place], [place] 812-022-6432   [facility] [address] [place], [place] 256-380-2463 Insurance/Medicaid/sponsorship through [facility] and Families [address] [place]                                    [place], [place] (712) 819-9897 [place] [address], [place] (905) [phone]    Dr. Adele Schilder  [phone]   [facility] Dept. 1) [address] [place] 2) [place] 3)  [place] [address], [place] [phone] [phone]  201-085-2284   [place] 301-766-3638 or 6302590159 (After Hours)      Take the prescription as directed.  Call your regular medical doctor tomorrow to schedule a follow up appointment within the next 2 days.  Return to the Emergency Department immediately sooner if worsening.
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
## 2014-07-29 NOTE — ED Notes (Signed)
Pt ambulated to bathroom with assistance. Pt tolerated well despite using walker at home.

## 2014-08-01 LAB — URINE CULTURE

## 2014-08-13 ENCOUNTER — Other Ambulatory Visit (HOSPITAL_COMMUNITY): Payer: Self-pay | Admitting: Oncology

## 2014-08-13 DIAGNOSIS — C7951 Secondary malignant neoplasm of bone: Secondary | ICD-10-CM

## 2014-08-13 MED ORDER — FENTANYL 100 MCG/HR TD PT72: 200.0000 ug | MEDICATED_PATCH | TRANSDERMAL | Status: DC

## 2014-08-23 ENCOUNTER — Encounter (HOSPITAL_COMMUNITY): Payer: Self-pay | Admitting: *Deleted

## 2014-08-23 ENCOUNTER — Emergency Department (HOSPITAL_COMMUNITY)

## 2014-08-23 ENCOUNTER — Emergency Department (HOSPITAL_COMMUNITY)
Admission: EM | Admit: 2014-08-23 | Discharge: 2014-08-23 | Disposition: A | Discharge status: Home / Self Care | Attending: Emergency Medicine | Admitting: Emergency Medicine

## 2014-08-23 DIAGNOSIS — Z792 Long term (current) use of antibiotics: Secondary | ICD-10-CM | POA: Diagnosis not present

## 2014-08-23 DIAGNOSIS — Z872 Personal history of diseases of the skin and subcutaneous tissue: Secondary | ICD-10-CM | POA: Insufficient documentation

## 2014-08-23 DIAGNOSIS — Z86010 Personal history of colonic polyps: Secondary | ICD-10-CM | POA: Diagnosis not present

## 2014-08-23 DIAGNOSIS — Z85038 Personal history of other malignant neoplasm of large intestine: Secondary | ICD-10-CM | POA: Insufficient documentation

## 2014-08-23 DIAGNOSIS — Z8742 Personal history of other diseases of the female genital tract: Secondary | ICD-10-CM | POA: Insufficient documentation

## 2014-08-23 DIAGNOSIS — Z8679 Personal history of other diseases of the circulatory system: Secondary | ICD-10-CM | POA: Diagnosis not present

## 2014-08-23 DIAGNOSIS — R609 Edema, unspecified: Secondary | ICD-10-CM | POA: Diagnosis not present

## 2014-08-23 DIAGNOSIS — D649 Anemia, unspecified: Secondary | ICD-10-CM | POA: Diagnosis not present

## 2014-08-23 DIAGNOSIS — H5441 Blindness, right eye, normal vision left eye: Secondary | ICD-10-CM | POA: Insufficient documentation

## 2014-08-23 DIAGNOSIS — Z8542 Personal history of malignant neoplasm of other parts of uterus: Secondary | ICD-10-CM | POA: Insufficient documentation

## 2014-08-23 DIAGNOSIS — Z8619 Personal history of other infectious and parasitic diseases: Secondary | ICD-10-CM | POA: Diagnosis not present

## 2014-08-23 DIAGNOSIS — M1990 Unspecified osteoarthritis, unspecified site: Secondary | ICD-10-CM | POA: Diagnosis not present

## 2014-08-23 DIAGNOSIS — Z8612 Personal history of poliomyelitis: Secondary | ICD-10-CM | POA: Diagnosis not present

## 2014-08-23 DIAGNOSIS — Z923 Personal history of irradiation: Secondary | ICD-10-CM | POA: Diagnosis not present

## 2014-08-23 DIAGNOSIS — Z87891 Personal history of nicotine dependence: Secondary | ICD-10-CM | POA: Insufficient documentation

## 2014-08-23 DIAGNOSIS — F0391 Unspecified dementia with behavioral disturbance: Secondary | ICD-10-CM | POA: Diagnosis not present

## 2014-08-23 DIAGNOSIS — F911 Conduct disorder, childhood-onset type: Secondary | ICD-10-CM | POA: Diagnosis present

## 2014-08-23 DIAGNOSIS — K219 Gastro-esophageal reflux disease without esophagitis: Secondary | ICD-10-CM | POA: Diagnosis not present

## 2014-08-23 DIAGNOSIS — Z79899 Other long term (current) drug therapy: Secondary | ICD-10-CM | POA: Insufficient documentation

## 2014-08-23 DIAGNOSIS — Z8701 Personal history of pneumonia (recurrent): Secondary | ICD-10-CM | POA: Insufficient documentation

## 2014-08-23 LAB — URINALYSIS, ROUTINE W REFLEX MICROSCOPIC
GLUCOSE, UA: NEGATIVE mg/dL
KETONES UR: 15 mg/dL — AB
LEUKOCYTES UA: NEGATIVE
Nitrite: NEGATIVE
PH: 5.5 (ref 5.0–8.0)
PROTEIN: 100 mg/dL — AB
Urobilinogen, UA: 4 mg/dL — ABNORMAL HIGH (ref 0.0–1.0)

## 2014-08-23 LAB — CBC WITH DIFFERENTIAL/PLATELET
BASOS ABS: 0 10*3/uL (ref 0.0–0.1)
Basophils Relative: 1 % (ref 0–1)
EOS ABS: 0.1 10*3/uL (ref 0.0–0.7)
Eosinophils Relative: 2 % (ref 0–5)
HCT: 29.4 % — ABNORMAL LOW (ref 36.0–46.0)
HEMOGLOBIN: 9.4 g/dL — AB (ref 12.0–15.0)
LYMPHS PCT: 16 % (ref 12–46)
Lymphs Abs: 0.7 10*3/uL (ref 0.7–4.0)
MCH: 28.1 pg (ref 26.0–34.0)
MCHC: 32 g/dL (ref 30.0–36.0)
MCV: 87.8 fL (ref 78.0–100.0)
MONO ABS: 0.4 10*3/uL (ref 0.1–1.0)
MONOS PCT: 10 % (ref 3–12)
NEUTROS ABS: 3.2 10*3/uL (ref 1.7–7.7)
NEUTROS PCT: 73 % (ref 43–77)
PLATELETS: 107 10*3/uL — AB (ref 150–400)
RBC: 3.35 MIL/uL — ABNORMAL LOW (ref 3.87–5.11)
RDW: 14.3 % (ref 11.5–15.5)
WBC: 4.4 10*3/uL (ref 4.0–10.5)

## 2014-08-23 LAB — PROTIME-INR
INR: 1.19 (ref 0.00–1.49)
Prothrombin Time: 15.3 seconds — ABNORMAL HIGH (ref 11.6–15.2)

## 2014-08-23 LAB — COMPREHENSIVE METABOLIC PANEL
ALK PHOS: 122 U/L (ref 38–126)
ALT: 9 U/L — ABNORMAL LOW (ref 14–54)
AST: 19 U/L (ref 15–41)
Albumin: 3.4 g/dL — ABNORMAL LOW (ref 3.5–5.0)
Anion gap: 9 (ref 5–15)
BILIRUBIN TOTAL: 1.1 mg/dL (ref 0.3–1.2)
BUN: 11 mg/dL (ref 6–20)
CALCIUM: 8.6 mg/dL — AB (ref 8.9–10.3)
CO2: 27 mmol/L (ref 22–32)
CREATININE: 0.57 mg/dL (ref 0.44–1.00)
Chloride: 99 mmol/L — ABNORMAL LOW (ref 101–111)
GFR calc Af Amer: >60 mL/min (ref >60)
GFR calc non Af Amer: >60 mL/min (ref >60)
Glucose, Bld: 108 mg/dL — ABNORMAL HIGH (ref 65–99)
Potassium: 4.1 mmol/L (ref 3.5–5.1)
SODIUM: 135 mmol/L (ref 135–145)
Total Protein: 6 g/dL — ABNORMAL LOW (ref 6.5–8.1)

## 2014-08-23 LAB — URINE MICROSCOPIC-ADD ON

## 2014-08-23 LAB — TYPE AND SCREEN
ABO/RH(D): O POS
Antibody Screen: NEGATIVE

## 2014-08-23 LAB — AMMONIA: Ammonia: 12 umol/L (ref 9–35)

## 2014-08-23 MED ORDER — OXYCODONE HCL 5 MG PO TABS
10.0000 mg | ORAL_TABLET | Freq: Once | ORAL | Status: AC
  Administered 2014-08-23: 10 mg via ORAL
  Filled 2014-08-23: qty 2

## 2014-08-23 MED ORDER — SODIUM CHLORIDE 0.9 % IV SOLN: 1000.0000 mL | INTRAVENOUS | Status: DC

## 2014-08-23 NOTE — ED Notes (Signed)
Patient's granddaughter Albin Felling -[phone], states that patient lives with daughter at night and granddaughter during daytime. Patients husband is deceased.

## 2014-08-23 NOTE — Discharge Instructions (Signed)
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
## 2014-08-23 NOTE — ED Notes (Signed)
Patient's granddaughter , Elmyra Ricks here to visit patient.
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
## 2014-08-24 ENCOUNTER — Other Ambulatory Visit (HOSPITAL_COMMUNITY): Payer: Self-pay | Admitting: Oncology

## 2014-08-25 ENCOUNTER — Emergency Department (HOSPITAL_COMMUNITY)
Admission: EM | Admit: 2014-08-25 | Discharge: 2014-08-25 | Disposition: A | Discharge status: Home / Self Care | Payer: Medicare Other | Attending: Emergency Medicine | Admitting: Emergency Medicine

## 2014-08-25 ENCOUNTER — Encounter (HOSPITAL_COMMUNITY): Payer: Self-pay

## 2014-08-25 DIAGNOSIS — F0390 Unspecified dementia without behavioral disturbance: Secondary | ICD-10-CM | POA: Diagnosis not present

## 2014-08-25 DIAGNOSIS — Z8619 Personal history of other infectious and parasitic diseases: Secondary | ICD-10-CM | POA: Insufficient documentation

## 2014-08-25 DIAGNOSIS — Z86010 Personal history of colonic polyps: Secondary | ICD-10-CM | POA: Insufficient documentation

## 2014-08-25 DIAGNOSIS — H5441 Blindness, right eye, normal vision left eye: Secondary | ICD-10-CM | POA: Insufficient documentation

## 2014-08-25 DIAGNOSIS — Z87891 Personal history of nicotine dependence: Secondary | ICD-10-CM | POA: Diagnosis not present

## 2014-08-25 DIAGNOSIS — Z8719 Personal history of other diseases of the digestive system: Secondary | ICD-10-CM | POA: Insufficient documentation

## 2014-08-25 DIAGNOSIS — K219 Gastro-esophageal reflux disease without esophagitis: Secondary | ICD-10-CM | POA: Diagnosis not present

## 2014-08-25 DIAGNOSIS — Z8542 Personal history of malignant neoplasm of other parts of uterus: Secondary | ICD-10-CM | POA: Diagnosis not present

## 2014-08-25 DIAGNOSIS — Z85038 Personal history of other malignant neoplasm of large intestine: Secondary | ICD-10-CM | POA: Diagnosis not present

## 2014-08-25 DIAGNOSIS — Z792 Long term (current) use of antibiotics: Secondary | ICD-10-CM | POA: Insufficient documentation

## 2014-08-25 DIAGNOSIS — M1990 Unspecified osteoarthritis, unspecified site: Secondary | ICD-10-CM | POA: Insufficient documentation

## 2014-08-25 DIAGNOSIS — Z923 Personal history of irradiation: Secondary | ICD-10-CM | POA: Diagnosis not present

## 2014-08-25 DIAGNOSIS — Z8742 Personal history of other diseases of the female genital tract: Secondary | ICD-10-CM | POA: Diagnosis not present

## 2014-08-25 DIAGNOSIS — Z79899 Other long term (current) drug therapy: Secondary | ICD-10-CM | POA: Insufficient documentation

## 2014-08-25 DIAGNOSIS — Z8612 Personal history of poliomyelitis: Secondary | ICD-10-CM | POA: Diagnosis not present

## 2014-08-25 DIAGNOSIS — Z8701 Personal history of pneumonia (recurrent): Secondary | ICD-10-CM | POA: Insufficient documentation
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
## 2014-08-25 NOTE — Discharge Instructions (Signed)
°Emergency Department Resource Guide °1) Find a Doctor and Pay Out of Pocket °Although you won't have to find out who is covered by your insurance plan, it is a good idea to ask around and get recommendations. You will then need to call the office and see if the doctor you have chosen will accept you as a new patient and what types of options they offer for patients who are self-pay. Some doctors offer discounts or will set up payment plans for their patients who do not have insurance, but you will need to ask so you aren't surprised when you get to your appointment. ° °2) Contact Your Local Health Department °Not all health departments have doctors that can see patients for sick visits, but many do, so it is worth a call to see if yours does. If you don't know where your local health department is, you can check in your phone book. The CDC also has a tool to help you locate your state's health department, and many state websites also have listings of all of their local health departments. ° °3) Find a Walk-in Clinic °If your illness is not likely to be very severe or complicated, you may want to try a walk in clinic. These are popping up all over the country in pharmacies, drugstores, and shopping centers. They're usually staffed by nurse practitioners or physician assistants that have been trained to treat common illnesses and complaints. They're usually fairly quick and inexpensive. However, if you have serious medical issues or chronic medical problems, these are probably not your best option. ° °No Primary Care Doctor: °- Call [facility] at  832-8000 - they can help you locate a primary care doctor that  accepts your insurance, provides certain services, etc. °- Physician Referral Service- 1-800-533-3463 ° °Chronic Pain Problems: °Organization         Address  Phone   Notes  °[facility]  (336) 297-2271 Patients need to be referred by their primary care doctor.  ° °Medication  Assistance: °Organization         Address  Phone   Notes  °[place] [facility] [address] °[place], [place] [address] (336) 641-8030 --Must be a resident of [place] °-- Must have NO insurance coverage whatsoever (no Medicaid/ Medicare, etc.) °-- The pt. MUST have a primary care doctor that directs their care regularly and follows them in the community °  °[facility]  (866) 331-1348   °[employer]  (888) 892-1162   ° °Agencies that provide inexpensive medical care: °Organization         Address  Phone   Notes  °[facility]  (336) 832-8035   °[facility]    (336) 832-7272   °[facility] [address] °[place], [place] [address] (336) 832-4777   °[facility] [address], °[place] (336) 271-4999   °[facility]    (336) 373-0678   °[facility]    (336) 272-1050   °[facility] ° [address], [place] Phone:  (336) 832-4444, Fax:  (336) 832-4440 Hours of Operation:  9 am - 6 pm, M-F.  Also accepts Medicaid/Medicare and self-pay.  °[facility] ° [address], [place] Phone: (336) [phone], Fax: (336) [phone]. Hours of Operation:  8:30 am - 5:30 pm, M-F.  Also accepts Medicaid and self-pay.  °[facility] [place] [address], [place] Phone: (336) 878-6027   °[facility] [address], [place], [place] (336)723-1848, Ext. 123 Mondays & Thursdays: 7-9 AM.  First 15 patients are seen on a first come, first serve basis. °  ° °Medicaid-accepting [place] Providers: ° °Organization         Address  Phone   Notes  °[facility] [address], [place] (336) 641-2100 Also accepts self-pay patients.  °[facility] [address], [place] ° (336) 856-9996   °[facility] [address], [place]  (336) 288-8857   °[facility] [address] [place] [address], [place] (336) 299-7000   °Veita Bland [address], [place]  ° (336) 373-1557 Only accepts [place] Access Medicaid patients after they have their name applied to their card.  ° °Self-Pay (no insurance) in [place]: ° °Organization         Address  Phone   Notes  °Sickle Cell Patients, [facility] [address], [place] (336) 832-1970   °[facility] [address], [place] (336) 832-4400   °[facility] ° [address], [place] (336) 992-4800   °[facility]/Dr. Osei-Bonsu ° [address] [place] [address], [place] or [address], [place] (336) [phone] Phone number for both [place] and [place] locations is the same.  °[facility] [address], [place] (336) 299-0000   °[facility] [address] [place] [address], [place] or [address] (336) 852-7530 °(336) 878-2260   °[facility] [address], [place] (336) 350-1642, phone; (336) 294-5005, fax Sees patients 1st and 3rd Saturday of every month.  Must not qualify for public or private insurance (i.e. Medicaid, Medicare, [place] Health Choice, Veterans' Benefits) • Household income should be no more than 200% of the poverty level •The clinic cannot treat you if you are pregnant or think you are pregnant • Sexually transmitted diseases are not treated at the clinic.  ° ° °Dental Care: °Organization         Address  Phone  Notes  °[place] [facility] [address], [place] (336) 641-6152 Accepts children up to age 21 who are enrolled in Medicaid or [place] Health Choice; pregnant women with a Medicaid card; and children who have applied for Medicaid or [place] Health Choice, but were declined, whose parents can pay a reduced fee at time of service.  °[place]  [facility] [place]  [address], [place] (336) 641-7733 Accepts children up to age 21 who are enrolled in Medicaid or [place] Health Choice; pregnant women with a Medicaid card; and children who have applied for Medicaid or [place] Health Choice, but were declined, whose parents can pay a reduced fee at time of service.  °[facility] ° [address], [place] (336) 641-4533 Patients are seen by appointment only. Walk-ins are not accepted. [facility] will see patients 18 years of age and older. °Monday - Tuesday (8am-5pm) °Most Wednesdays (8:30-5pm) °$30 per visit, cash only  °[facility] ° [address], [place] (336) 641-4533 Patients are seen by appointment only. Walk-ins are not accepted. [facility] will see patients 18 years of age and older. °One   Wednesday Evening (Monthly: Volunteer Based).  $30 per visit, cash only  °[facility]  (919) 537-3737 for adults; Children under age 4, call Graduate Pediatric Dentistry at ([phone]) [phone]. Children aged 4-14, please call (919) [phone] to request a pediatric application. ° Dental services are provided in all areas of dental care including fillings, crowns and bridges, complete and partial dentures, implants, gum treatment, root canals, and extractions. Preventive care is also provided. Treatment is provided to both adults and children. °Patients are selected via a lottery and there is often a waiting list. °  °[facility] [address], °[place] ° (336) 763-8833 [email or web address] °  °[facility] [address], [place], [place] (336)723-1848, Ext. 123 Second and Fourth Thursday of each month, opens at 6:30 AM; Clinic ends at 9 AM.  Patients are seen on a first-come first-served basis, and a limited number are seen during each clinic.  ° °[facility] ° [address], [place], [place] (336) 723-7904    Eligibility Requirements °You must have lived in [place], or [place] counties for at least the last three months. °  You cannot be eligible for state or federal sponsored healthcare insurance, including Veterans Administration, Medicaid, or Medicare. °  You generally cannot be eligible for healthcare insurance through your employer.  °  How to apply: °Eligibility screenings are held every Tuesday and Wednesday afternoon from 1:00 pm until 4:00 pm. You do not need an appointment for the interview!  °[facility] [address], [place], [place] 336-631-2330   °[facility]  336-342-8273   °[facility]  336-703-3100   °[facility]  336-570-6415   ° °Behavioral Health Resources in the Community: °Intensive Outpatient Programs °Organization         Address  Phone  Notes  °[facility] [address], [place], [place] 336-878-6098   °[facility] [address], [place], [place] 336-832-9800   °[facility] [address], [place], [place] ° 336-882-2125   °[facility] [address],  °[place], [place] 1-800-853-5163 or 336-641-4981   °Substance Abuse Resources °Organization         Address  Phone  Notes  °[facility]  336-882-2125   °[facility]  336-784-9470   °[facility]  336-285-9073   °[facility]  336-845-3988   °Residential & Outpatient Substance Abuse Program  1-800-659-3381   °Psychological Services °Organization         Address  Phone  Notes  °[facility]  336- 832-9600   °[facility]  336- 378-7881   °[facility] [address], [place] 1-800-853-5163 or 336-641-4981   ° °Mobile Crisis Teams °Organization         Address  Phone  Notes  °[facility], Mobile Crisis Care Unit  1-877-626-1772   °Assertive °[facility] ° [address]  [place], [place] 336-834-9664   °Sharon DeEsch [address] °[place] 336-554-5454   ° °Self-Help/Support Groups °Organization         Address  Phone             Notes  °[facility] - variety of support groups  336- 373-1402 Call for more information  °Narcotics Anonymous (NA), [facility] [address], °[place]   2 meetings at this location  ° °  Residential Treatment Programs Organization         Address  Phone  Notes  [facility] [address],    [place]  1-(223) 108-1829   [facility]  [address], [place] [address], [place], [place]   [place], [place] (505)145-4692 Admissions: 8am-3pm M-F  [facility] [place] [address],    [place], [place] 119-417-4081   [facility] [address] [place], [place], [place]   [facility] [address],  [place], [place]   [facility] Intensive Outpatient [address], [place], [place]   [facility] [address]  [place], [place] 1-(256) 339-9464 or 415-806-9938   [facility] [address] [place], [place] Accepts Medicaid  [facility] [address],  [place] 1-907-505-9187 Substance Abuse/Addiction Treatment   [facility]         Address  Phone  Notes  [facility]  (410) 684-0688   Domenic Schwab, PhD [address] [place], [place]   (819) 717-7707 or [phone]   [facility] [place], [place] (781)248-9454   [facility] [address], [place], [place] (828) 195-3602 Insurance/Medicaid/sponsorship through [facility] [address]                                    [place], [place] (214)310-2570 [facility] [address], [place] [phone]    Dr. Adele Schilder  [phone]   [facility] Dept. 1) [address], [place] 2) [place] 3)  [address], [place] [phone] [phone]) [phone]  [phone]   [place] 985-324-7822 or [phone] (After Hours)      Take her usual prescriptions as previously directed.  Call your regular medical doctor on Tuesday to schedule a follow up appointment within the next 2 days.  Return to the Emergency Department immediately sooner if worsening.

## 2014-08-25 NOTE — ED Notes (Signed)
Per EMS, called out to resident by family member stating pt was having chest pain. Pt denies chest pain. Per EMS, pt's daughter request pt be kept in the hospital overnight so the daughter can get some rest.
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
## 2014-09-19 ENCOUNTER — Other Ambulatory Visit (HOSPITAL_COMMUNITY): Payer: Self-pay | Admitting: Oncology

## 2014-09-19 DIAGNOSIS — C541 Malignant neoplasm of endometrium: Secondary | ICD-10-CM

## 2014-09-19 MED ORDER — FENTANYL 100 MCG/HR TD PT72: MEDICATED_PATCH | TRANSDERMAL | Status: DC

## 2014-10-01 ENCOUNTER — Other Ambulatory Visit (HOSPITAL_COMMUNITY): Payer: Self-pay | Admitting: Oncology

## 2014-10-01 DIAGNOSIS — C541 Malignant neoplasm of endometrium: Secondary | ICD-10-CM

## 2014-10-01 MED ORDER — FENTANYL 100 MCG/HR TD PT72: MEDICATED_PATCH | TRANSDERMAL | Status: DC

## 2014-10-10 ENCOUNTER — Other Ambulatory Visit (HOSPITAL_COMMUNITY): Payer: Self-pay | Admitting: Oncology

## 2014-10-10 DIAGNOSIS — C541 Malignant neoplasm of endometrium: Secondary | ICD-10-CM

## 2014-10-10 MED ORDER — FENTANYL 100 MCG/HR TD PT72: 300.0000 ug | MEDICATED_PATCH | TRANSDERMAL | Status: DC

## 2014-10-21 ENCOUNTER — Other Ambulatory Visit (HOSPITAL_COMMUNITY): Payer: Self-pay | Admitting: Oncology

## 2014-10-21 DIAGNOSIS — C541 Malignant neoplasm of endometrium: Secondary | ICD-10-CM

## 2014-10-21 MED ORDER — FENTANYL 100 MCG/HR TD PT72: 300.0000 ug | MEDICATED_PATCH | TRANSDERMAL | Status: AC

## 2014-11-23 DEATH — deceased

## 2016-01-15 IMAGING — DX DG CHEST 2V
2 series · 2 of 2 positions shown · non-contrast
Comparison: 04/01/2014

CLINICAL DATA: Altered mental status for several days. Colon cancer
metastatic to bone. Endometrial cancer. History of polio.

EXAM:
CHEST  2 VIEW

[chest lat]
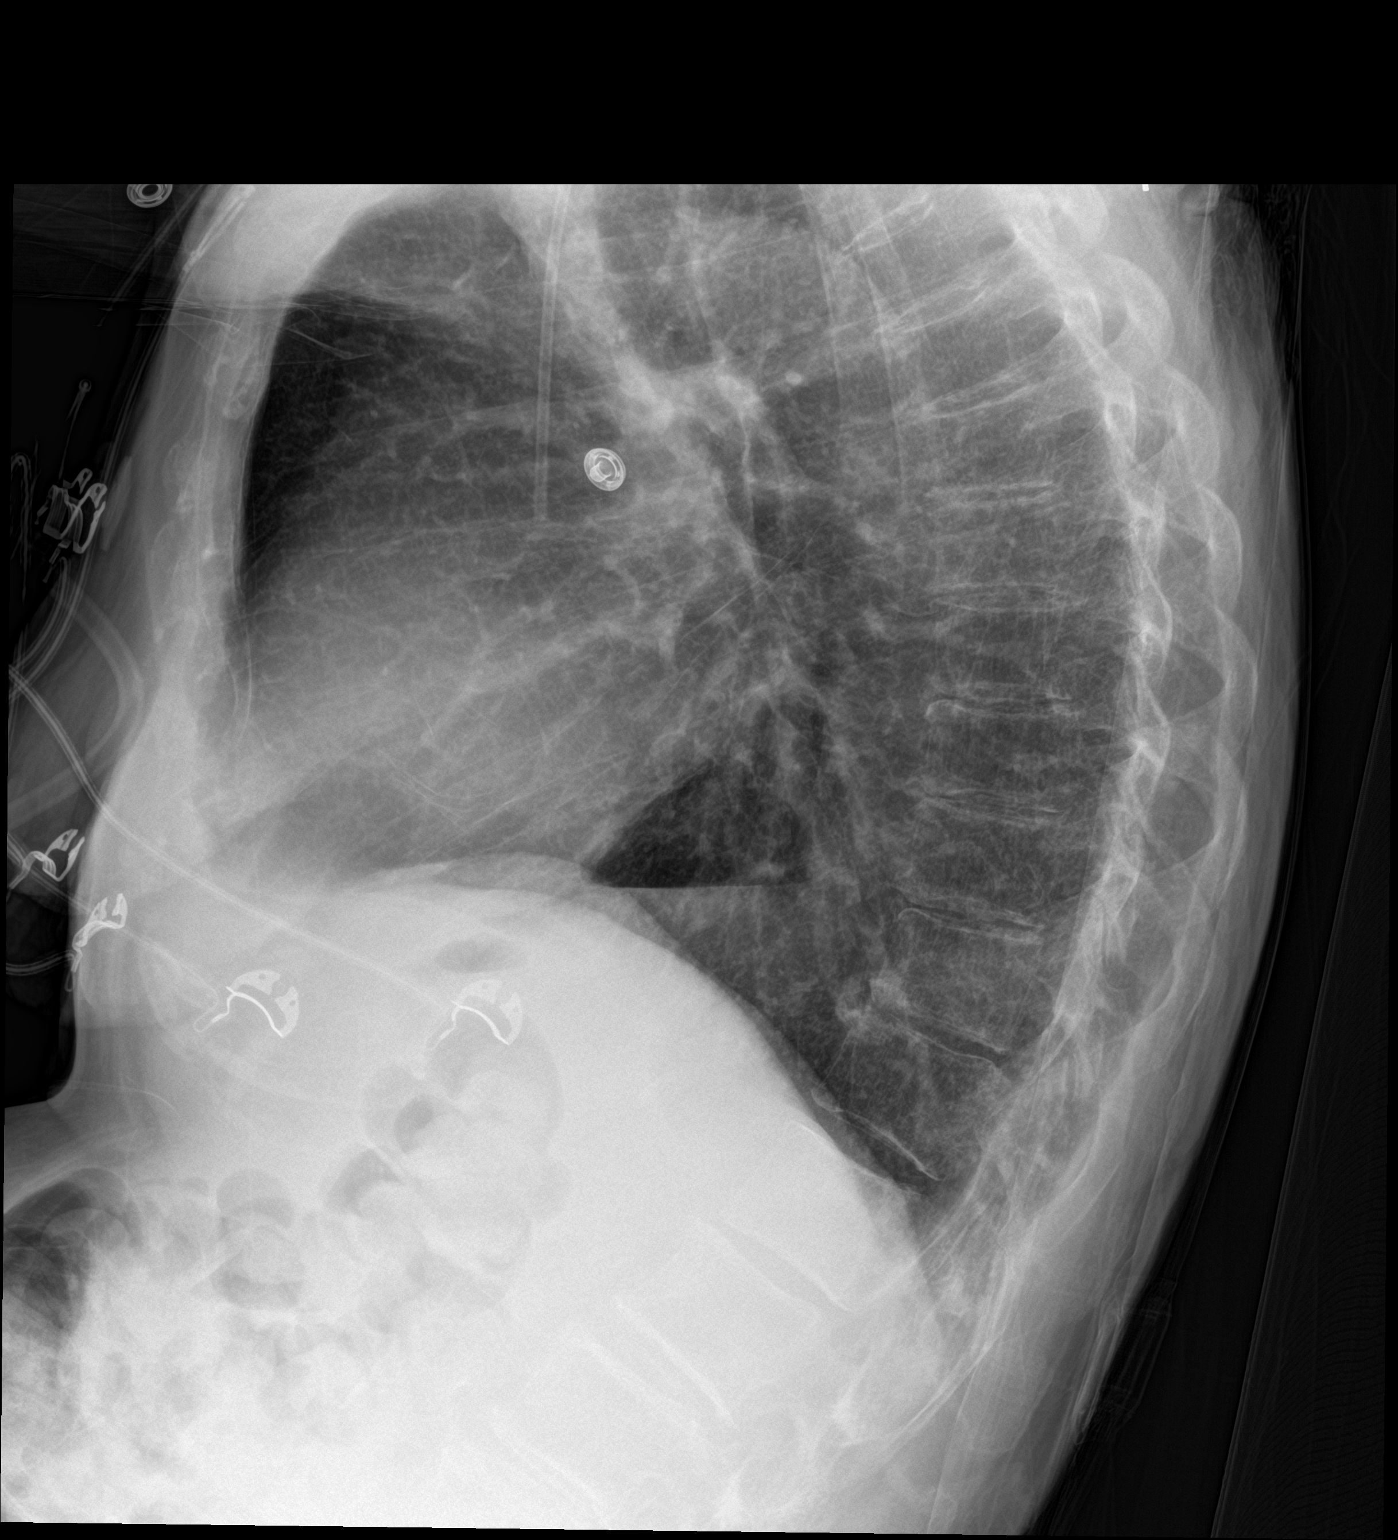

[chest ap]
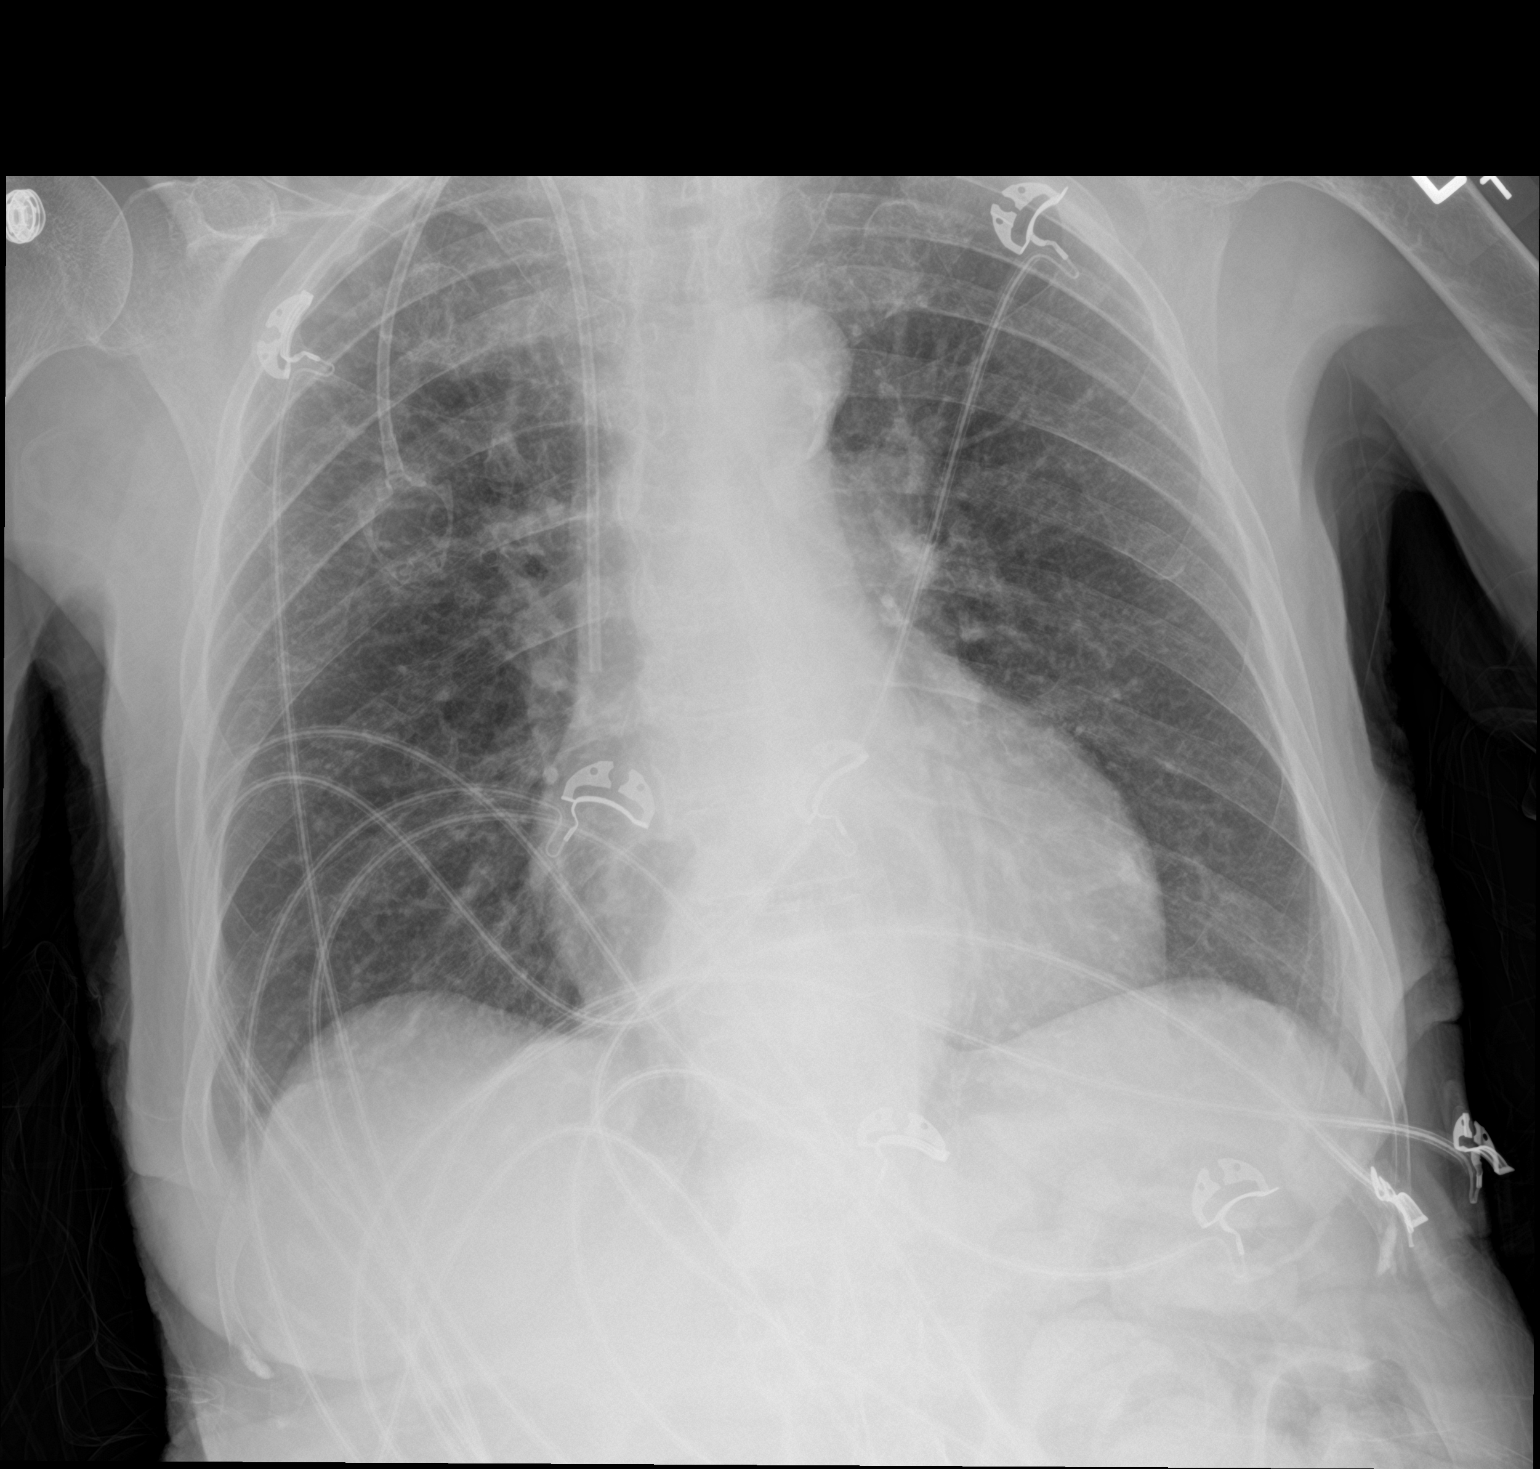

[2 of 2 positions shown; findings below may reference images not displayed]

FINDINGS: Power injectable right-sided [name], tip projects over the SVC.

Small hiatal hernia.  Atherosclerotic aortic arch.

Chronic interstitial accentuation in the lungs. No pneumonia
identified.
IMPRESSION: 1. No acute thoracic findings.
2. Chronic hiatal hernia.
3. Atherosclerotic aortic arch.
4. Chronic interstitial accentuation.
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
